# Patient Record
Sex: Female | Born: 1975 | Race: Black or African American | Hispanic: No | Smoking: Never smoker
Health system: Southern US, Community
[De-identification: ages and names within clinical notes are randomized; demographics above are authoritative.]

## PROBLEM LIST (undated history)

## (undated) DIAGNOSIS — T7840XA Allergy, unspecified, initial encounter: Secondary | ICD-10-CM

## (undated) HISTORY — PX: WISDOM TOOTH EXTRACTION: SHX21

## (undated) HISTORY — DX: Allergy, unspecified, initial encounter: T78.40XA

---

## 2008-11-06 ENCOUNTER — Emergency Department (HOSPITAL_COMMUNITY): Admission: EM | Admit: 2008-11-06 | Discharge: 2008-11-06 | Payer: Self-pay | Admitting: Emergency Medicine

## 2009-12-30 ENCOUNTER — Emergency Department (HOSPITAL_COMMUNITY): Admission: EM | Admit: 2009-12-30 | Discharge: 2009-12-30 | Payer: Self-pay | Admitting: Emergency Medicine

## 2009-12-30 DIAGNOSIS — M25579 Pain in unspecified ankle and joints of unspecified foot: Secondary | ICD-10-CM | POA: Insufficient documentation

## 2010-06-04 ENCOUNTER — Ambulatory Visit: Payer: Self-pay | Admitting: Vascular Surgery

## 2010-09-13 ENCOUNTER — Encounter: Payer: Self-pay | Admitting: Internal Medicine

## 2010-09-13 LAB — CONVERTED CEMR LAB

## 2011-01-12 ENCOUNTER — Ambulatory Visit: Admit: 2011-01-12 | Payer: Self-pay | Admitting: Internal Medicine

## 2011-01-21 ENCOUNTER — Other Ambulatory Visit: Payer: Self-pay | Admitting: Internal Medicine

## 2011-01-21 ENCOUNTER — Other Ambulatory Visit: Payer: Self-pay

## 2011-01-21 ENCOUNTER — Encounter: Payer: Self-pay | Admitting: Internal Medicine

## 2011-01-21 ENCOUNTER — Ambulatory Visit (INDEPENDENT_AMBULATORY_CARE_PROVIDER_SITE_OTHER): Payer: Medicaid Other | Admitting: Internal Medicine

## 2011-01-21 DIAGNOSIS — Z23 Encounter for immunization: Secondary | ICD-10-CM

## 2011-01-21 DIAGNOSIS — J45909 Unspecified asthma, uncomplicated: Secondary | ICD-10-CM | POA: Insufficient documentation

## 2011-01-21 DIAGNOSIS — Z0000 Encounter for general adult medical examination without abnormal findings: Secondary | ICD-10-CM

## 2011-01-21 DIAGNOSIS — J309 Allergic rhinitis, unspecified: Secondary | ICD-10-CM | POA: Insufficient documentation

## 2011-01-21 LAB — CBC WITH DIFFERENTIAL/PLATELET
Basophils Absolute: 0.1 10*3/uL (ref 0.0–0.1)
Basophils Relative: 0.8 % (ref 0.0–3.0)
Eosinophils Absolute: 0.3 10*3/uL (ref 0.0–0.7)
Eosinophils Relative: 2.8 % (ref 0.0–5.0)
HCT: 38.6 % (ref 36.0–46.0)
Hemoglobin: 13.3 g/dL (ref 12.0–15.0)
Lymphocytes Relative: 19.6 % (ref 12.0–46.0)
Lymphs Abs: 1.8 10*3/uL (ref 0.7–4.0)
MCHC: 34.5 g/dL (ref 30.0–36.0)
MCV: 88.9 fl (ref 78.0–100.0)
Monocytes Absolute: 0.7 10*3/uL (ref 0.1–1.0)
Monocytes Relative: 7.3 % (ref 3.0–12.0)
Neutro Abs: 6.2 10*3/uL (ref 1.4–7.7)
Neutrophils Relative %: 69.5 % (ref 43.0–77.0)
Platelets: 242 10*3/uL (ref 150.0–400.0)
RBC: 4.34 Mil/uL (ref 3.87–5.11)
RDW: 13.5 % (ref 11.5–14.6)
WBC: 9 10*3/uL (ref 4.5–10.5)

## 2011-01-21 LAB — LIPID PANEL
Cholesterol: 159 mg/dL (ref 0–200)
HDL: 74.1 mg/dL (ref >39.00)
LDL Cholesterol: 75 mg/dL (ref 0–99)
Total CHOL/HDL Ratio: 2
Triglycerides: 50 mg/dL (ref 0.0–149.0)
VLDL: 10 mg/dL (ref 0.0–40.0)

## 2011-01-21 LAB — HEPATIC FUNCTION PANEL
ALT: 15 U/L (ref 0–35)
AST: 19 U/L (ref 0–37)
Albumin: 4.1 g/dL (ref 3.5–5.2)
Alkaline Phosphatase: 65 U/L (ref 39–117)
Bilirubin, Direct: 0.1 mg/dL (ref 0.0–0.3)
Total Bilirubin: 0.4 mg/dL (ref 0.3–1.2)
Total Protein: 7.7 g/dL (ref 6.0–8.3)

## 2011-01-21 LAB — BASIC METABOLIC PANEL
BUN: 10 mg/dL (ref 6–23)
CO2: 28 mEq/L (ref 19–32)
Calcium: 9.3 mg/dL (ref 8.4–10.5)
Chloride: 105 mEq/L (ref 96–112)
Creatinine, Ser: 0.7 mg/dL (ref 0.4–1.2)
GFR: 133.29 mL/min (ref >60.00)
Glucose, Bld: 75 mg/dL (ref 70–99)
Potassium: 4.3 mEq/L (ref 3.5–5.1)
Sodium: 139 mEq/L (ref 135–145)

## 2011-01-21 LAB — TSH: TSH: 0.73 u[IU]/mL (ref 0.35–5.50)

## 2011-01-29 NOTE — Assessment & Plan Note (Signed)
Summary: Stephanie Moore,medicaid,#,lb   Vital Signs:  Patient profile:   35 year old female Height:      67 inches (170.18 cm) Weight:      150.0 pounds (68.18 kg) BMI:     23.58 O2 Sat:      98 % on Room air Temp:     99.2 degrees F (37.33 degrees C) oral Pulse rate:   70 / minute BP sitting:   92 / 62  (left arm) Cuff size:   regular  Vitals Entered By: Orlan Leavens RMA (January 21, 2011 10:17 AM)  O2 Flow:  Room air CC: Stephanie patient Is Patient Diabetic? No Pain Assessment Patient in pain? no        Primary Care Provider:  Newt Lukes MD  CC:  Stephanie patient.  History of Present Illness: Stephanie Moore to me and our prctice, here to est care  patient is here today for annual physical. Patient feels well and has no complaints.   reviewed chronic med issues asthma - flare every april-may - uses advair as needed and freq steroid shots during spring season  allg rhinitis - only seasonal - nop current symptoms - uses otc meds as needed   Preventive Screening-Counseling & Management  Alcohol-Tobacco     Alcohol drinks/day: <1     Alcohol Counseling: not indicated; use of alcohol is not excessive or problematic     Smoking Status: never     Tobacco Counseling: not indicated; no tobacco use  Caffeine-Diet-Exercise     Does Patient Exercise: yes     Exercise (avg: min/session): 30-60     Times/week: 4     Exercise Counseling: not indicated; exercise is adequate     Depression Counseling: not indicated; screening negative for depression  Safety-Violence-Falls     Seat Belt Counseling: to use seat belts when in vehicle     Helmet Counseling: not indicated; patient wears helmet when riding bicycle/motocycle     Firearm Counseling: not applicable     Violence Counseling: not applicable     Fall Risk Counseling: not indicated; no significant falls noted  Clinical Review Panels:  Prevention   Last Pap Smear:  Interpretation Result:Negative for intraepithelial Lesion or  Malignancy.    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 I have reviewed all other systems and they were negative.   Physical Exam  General:  alert, well-developed, well-nourished, and cooperative to examination.    Head:  Normocephalic and atraumatic without obvious abnormalities. No apparent alopecia or balding. Eyes:  vision grossly intact; pupils equal, round and reactive to light.  conjunctiva and lids normal.    Ears:  normal pinnae bilaterally, without erythema, swelling, or tenderness to palpation. TMs clear, without effusion, or cerumen impaction. Hearing grossly normal bilaterally  Mouth:  teeth  and gums in good repair; mucous membranes moist, without lesions or ulcers. oropharynx clear without exudate, no erythema.  Neck:  supple, full ROM, no masses, no thyromegaly; no thyroid nodules or tenderness. no JVD or carotid bruits.   Lungs:  normal respiratory effort, no intercostal retractions or use of accessory muscles; normal breath sounds bilaterally - no crackles and no wheezes.    Heart:  normal rate, regular rhythm, no murmur, and no rub. BLE without edema. normal DP pulses and normal cap refill in all 4 extremities    Abdomen:  soft, non-tender, normal bowel sounds, no distention; no masses and no appreciable  hepatomegaly or splenomegaly.   Genitalia:  defer gyn Msk:  No deformity or scoliosis noted of thoracic or lumbar spine.   Neurologic:  alert & oriented X3 and cranial nerves II-XII symetrically intact.  strength normal in all extremities, sensation intact to light touch, and gait normal. speech fluent without dysarthria or aphasia; follows commands with good comprehension.  Skin:  no rashes, vesicles, ulcers, or erythema. No nodules or irregularity to palpation.  Psych:  Oriented X3, memory intact for recent and remote, normally interactive, good eye contact, not anxious appearing, not depressed appearing, and not agitated.      Impression & Recommendations:  Problem # 1:  PREVENTIVE HEALTH CARE (ICD-V70.0) Patient has been counseled on age-appropriate routine health concerns for screening and prevention. These are reviewed and up-to-date. Immunizations are up-to-date or declined. Labsordered, will be reviewed.  Orders: TLB-Lipid Panel (80061-LIPID) TLB-BMP (Basic Metabolic Panel-BMET) (80048-METABOL) TLB-CBC Platelet - w/Differential (85025-CBCD) TLB-Hepatic/Liver Function Pnl (80076-HEPATIC) TLB-TSH (Thyroid Stimulating Hormone) (84443-TSH)  Complete Medication List: 1)  Advair Diskus 250-50 Mcg/dose Aepb (Fluticasone-salmeterol) .... Two times a day as needed  Other Orders: Tdap => 41yrs IM (16109) Admin 1st Vaccine (60454)  Patient Instructions: 1)  it was good to see you today. 2)  medications and histroy reviewed 3)  exam looks good today - Tdap shot today 4)  test(s) ordered today - your results will be posted on the phone tree for review in 48-72 hours from the time of test completion; call [phone] and enter your 9 digit MRN (listed above on this page, just below your name); if any changes need to be made or there are abnormal results, you will be contacted directly. 5)  Please schedule a follow-up appointment annually for medical physical and labs, call sooner if  problems.    Orders Added: 1)  TLB-Lipid Panel [80061-LIPID] 2)  TLB-BMP (Basic Metabolic Panel-BMET) [80048-METABOL] 3)  TLB-CBC Platelet - w/Differential [85025-CBCD] 4)  TLB-Hepatic/Liver Function Pnl [80076-HEPATIC] 5)  TLB-TSH (Thyroid Stimulating Hormone) [84443-TSH] 6)  Stephanie Patient 18-39 years [99385] 7)  Tdap => 70yrs IM [90715] 8)  Admin 1st Vaccine [90471]   Immunizations Administered:  Tetanus Vaccine:    Vaccine Type: Tdap    Site: left deltoid    Mfr: GlaxoSmithKline    Dose: 0.5 ml    Route: IM    Given by: Orlan Leavens RMA    Exp. Date: 10/02/2012    Lot #: [id number]    VIS given: 01/21/11   Immunizations Administered:  Tetanus Vaccine:    Vaccine Type: Tdap    Site: left deltoid    Mfr: GlaxoSmithKline    Dose: 0.5 ml    Route: IM    Given by: Orlan Leavens RMA    Exp. Date: 10/02/2012    Lot #: [id number]    VIS given: 01/21/11   Pap Smear  Procedure date:  09/13/2010  Findings:      Interpretation Result:Negative for intraepithelial Lesion or Malignancy.

## 2011-03-07 ENCOUNTER — Emergency Department (HOSPITAL_COMMUNITY)
Admission: EM | Admit: 2011-03-07 | Discharge: 2011-03-08 | Disposition: A | Discharge status: Home / Self Care | Payer: Medicaid Other | Attending: Emergency Medicine | Admitting: Emergency Medicine

## 2011-03-07 DIAGNOSIS — J45909 Unspecified asthma, uncomplicated: Secondary | ICD-10-CM | POA: Insufficient documentation

## 2011-03-14 ENCOUNTER — Encounter: Payer: Self-pay | Admitting: *Deleted

## 2011-03-19 ENCOUNTER — Encounter: Payer: Self-pay | Admitting: Internal Medicine

## 2011-03-19 ENCOUNTER — Ambulatory Visit (INDEPENDENT_AMBULATORY_CARE_PROVIDER_SITE_OTHER): Payer: Medicaid Other | Admitting: Internal Medicine

## 2011-03-19 DIAGNOSIS — J309 Allergic rhinitis, unspecified: Secondary | ICD-10-CM

## 2011-03-19 DIAGNOSIS — J45909 Unspecified asthma, uncomplicated: Secondary | ICD-10-CM

## 2011-03-19 MED ORDER — KETOTIFEN FUMARATE 0.025 % OP SOLN: 1.0000 [drp] | Freq: Two times a day (BID) | OPHTHALMIC | Status: AC

## 2011-03-19 MED ORDER — ALBUTEROL SULFATE HFA 108 (90 BASE) MCG/ACT IN AERS: 2.0000 | INHALATION_SPRAY | RESPIRATORY_TRACT | Status: DC | PRN

## 2011-03-19 MED ORDER — OMEPRAZOLE 20 MG PO CPDR: 20.0000 mg | DELAYED_RELEASE_CAPSULE | Freq: Every day | ORAL | Status: DC

## 2011-03-19 MED ORDER — FLUTICASONE PROPIONATE HFA 220 MCG/ACT IN AERO
2.0000 | INHALATION_SPRAY | Freq: Two times a day (BID) | RESPIRATORY_TRACT | Status: DC
Start: 2011-03-19 — End: 2011-03-26

## 2011-03-19 MED ORDER — LORATADINE 10 MG PO TABS: 10.0000 mg | ORAL_TABLET | Freq: Every day | ORAL | Status: DC

## 2011-03-19 MED ORDER — PREDNISONE (PAK) 10 MG PO TABS: 10.0000 mg | ORAL_TABLET | ORAL | Status: AC

## 2011-03-19 NOTE — Patient Instructions (Signed)
It was good to see you today. ER visit and medications reviewed today - use as explained - see below Your prescription(s) have been submitted to your pharmacy. Please take as directed and contact our office if you believe you are having problem(s) with the medication(s). Please schedule followup in 4-6 weeks to review allergy and asthma symptoms, call sooner if problems.

## 2011-03-19 NOTE — Assessment & Plan Note (Signed)
Acute exac with ER visit 3/24 explained importance of inhaled steroids in addition to rescue MDI Also allergy control and PPI x 30d Retreat 6 day pred pak for residual wheeze but no antibiotics indicated

## 2011-03-19 NOTE — Progress Notes (Signed)
  Subjective:    Patient ID: Stephanie Moore, female    DOB: [birth year], 35 y.o.   MRN: [id number]  HPI Here for ER followup Seen 3/24 at [facility] for asthma flare - tx pred pak, flovent and ventolin hfa Noncompliant with advair ("haven't had that medication in years") Improved but persisting DOE and wheeze symptoms worse with exertion or lying supine +cough, dry without sputum Chest tight but not painful +itch watery eye and allergy symptoms like sneezing and PND - not using any med for same +hx same each spring  Past Medical History  Diagnosis Date  . Allergy   . Asthma    Review of Systems  Constitutional: Negative for fever and fatigue.  HENT: Positive for congestion, sneezing and postnasal drip. Negative for ear pain.   Eyes: Positive for itching. Negative for pain.  Respiratory: Positive for cough, shortness of breath and wheezing. Negative for stridor.   Cardiovascular: Negative for chest pain and leg swelling.  Skin: Negative for rash.       Objective:   Physical Exam  Constitutional: She is oriented to person, place, and time. She appears well-developed and well-nourished. No distress.  HENT:  Head: Normocephalic and atraumatic.  Right Ear: External ear normal.  Nose: Nose normal.  Mouth/Throat: Oropharynx is clear and moist.       +PND  Neck: Normal range of motion. Neck supple.  Cardiovascular: Normal rate and regular rhythm.  Exam reveals friction rub.   No murmur heard. Pulmonary/Chest: She is in respiratory distress (mild increase WOB with conversation). She has wheezes. She has no rales.  Abdominal: Soft. Bowel sounds are normal.  Lymphadenopathy:    She has no cervical adenopathy.  Neurological: She is alert and oriented to person, place, and time.      Assessment & Plan:  See problem list. Medications and labs reviewed today. Time spent with the patient 30 minutes, greater than 50% time spent counseling patient on allergy, asthma and medication review. Also  review of ER records

## 2011-03-19 NOTE — Assessment & Plan Note (Signed)
Acute seasonal flare - medications reviewed Acute asthma exac and recent ER visit related to same -

## 2011-03-24 ENCOUNTER — Telehealth: Payer: Self-pay | Admitting: Internal Medicine

## 2011-03-24 NOTE — Telephone Encounter (Signed)
QVAR & Budesonide are the preferred drugs for Flovent w/ [facility] Medicaid Do you wish to proceed w/Flovent PA efforts? Please Advise.

## 2011-03-24 NOTE — Telephone Encounter (Signed)
PA requested for Flovent [facility] PA form requested from [facility] Medicaid  @[phone] 03/19/11 PA form received and forwarded to MD for completion 03/22/11 PA form returned from MD w/inquiry attached regarding preferred drugs for Flovent Called [place] [employer] to inquire on preferred drugs: QVAR & Budesonide Sent phone note to MD w/this information & inquiry if further PA efforts will be needed for Flovent.

## 2011-03-26 MED ORDER — BECLOMETHASONE DIPROPIONATE 80 MCG/ACT IN AERS: 2.0000 | INHALATION_SPRAY | Freq: Two times a day (BID) | RESPIRATORY_TRACT | Status: DC

## 2011-03-26 NOTE — Telephone Encounter (Signed)
No need for PA - flovent changed to qvar - new erx done - please let pt know same - thanks

## 2011-03-30 NOTE — Telephone Encounter (Signed)
New Rx information faxed to pharmacy.

## 2011-04-28 NOTE — Consult Note (Signed)
NEW PATIENT CONSULTATION   Stephanie Moore, Stephanie Moore  DOB:  09-18-1976                                       06/04/2010  ZOXWR#:[id number]   The patient is in today for evaluation of pain in her left posterior  popliteal fossa.  She is an otherwise healthy, active black female with  no major medical difficulties.  She reports that she is quite active in  an exercise program and does have discomfort in the area of her  popliteal fossa extending just above the popliteal crease to the  proximal calf.  This is worse with prolonged standing.  She has worn  support hose and this has not given her any improvement and actually has  made this worse.  She has no history of DVT or bleeding.   PAST MEDICAL HISTORY:  Is negative for diabetes, hypertension, elevated  cholesterol or cardiac disease.   FAMILY HISTORY:  Is negative for premature atherosclerotic disease.   SOCIAL HISTORY:  She is single.  She has three children.  She is not  retired.  She cleans houses.  She does not smoke and has occasional  social alcohol consumption.   REVIEW OF SYSTEMS:  No weight loss or weight gain.  Her weight is  reported 145 pounds.  She is 5 feet 6 inches tall.  CARDIAC:  Is negative.  PULMONARY:  Positive for asthma.  GI/GU:  Is negative.  VASCULAR:  Positive only for discomfort in her posterior fossa.  NEUROLOGIC:  Negative.  MUSCULOSKELETAL:  Does have some knee pain in the joint itself on the  left.  PSYCHIATRIC, ENT, HEMATOLOGIC, SKIN:  Are all negative.   PHYSICAL EXAM:  Well-developed, well-nourished black female appearing  stated age of 82 in no acute distress.  Blood pressure 90/60, pulse 68,  respirations 18.  HEENT is normal.  She has 2+ radial, 2+ dorsalis pedis  pulses bilaterally.  Musculoskeletal:  Shows no major deformities or  cyanosis.  Neurologic:  No focal weakness or paresthesias.  Skin:  Without ulcers or rashes.  She does have no evidence of telangiectasia  on her  right leg, on her left leg she has prominent telangiectasia that  are above the skin in her popliteal fossa and extending just above and  just below this level.   She underwent noninvasive vascular laboratory studies in our office  which I have ordered and interpreted and reviewed with the patient.  This shows no evidence of deep or superficial reflux with normal  vascular exam.  I discussed this with the patient.  Since her discomfort  is related to this specific area, I feel that in all likelihood it is  related to these prominent telangiectasia.  I explained the treatment  for this would be sclerotherapy of this area.  I explained the out-of-  pocket expense related to this since it would not be covered.  She  understands and wishes to proceed when she can fit this in her schedule.  She understands that hopefully this will resolve the pain but that there  is a chance that it is unrelated, although it is in the same region and  that she may benefit from a cosmetic standpoint but not necessarily from  a discomfort standpoint.  She understands this and wishes to proceed.     Larina Earthly, M.D.  Electronically Signed  TFE/MEDQ  D:  06/04/2010  T:  06/05/2010  Job:  [id number]

## 2011-04-28 NOTE — Procedures (Signed)
LOWER EXTREMITY VENOUS REFLUX EXAM   INDICATION:  Followup painful varicose veins.   EXAM:  Using color-flow imaging and pulse Doppler spectral analysis, the  bilateral common femoral, superficial femoral, popliteal, posterior  tibial, greater and lesser saphenous veins were evaluated.  There is no  evidence suggesting deep venous insufficiency in the bilateral lower  extremity.   The bilateral saphenofemoral junction is competent.  The bilateral GSV  is competent.   The bilateral proximal short saphenous vein demonstrates competency.   GSV Diameter (used if found to be incompetent only)                                            Right    Left  Proximal Greater Saphenous Vein           cm       cm  Proximal-to-mid-thigh                     cm       cm  Mid thigh                                 cm       cm  Mid-distal thigh                          cm       cm  Distal thigh                              cm       cm  Knee                                      cm       cm   IMPRESSION:  1. There is no evidence of reflux in the bilateral greater saphenous      vein.  2. The bilateral greater saphenous vein is not aneurysmal.  3. The bilateral greater saphenous vein is not tortuous.  4. The deep venous system is competent.  5. The bilateral lesser saphenous vein is competent.   ___________________________________________  Larina Earthly, M.D.   CB/MEDQ  D:  06/05/2010  T:  06/05/2010  Job:  [id number]

## 2011-12-21 ENCOUNTER — Emergency Department (HOSPITAL_COMMUNITY): Payer: Self-pay

## 2011-12-21 ENCOUNTER — Emergency Department (HOSPITAL_COMMUNITY)
Admission: EM | Admit: 2011-12-21 | Discharge: 2011-12-21 | Disposition: A | Discharge status: Home / Self Care | Payer: Self-pay | Attending: Emergency Medicine | Admitting: Emergency Medicine

## 2011-12-21 ENCOUNTER — Encounter (HOSPITAL_COMMUNITY): Payer: Self-pay | Admitting: Emergency Medicine

## 2011-12-21 DIAGNOSIS — B349 Viral infection, unspecified: Secondary | ICD-10-CM

## 2011-12-21 DIAGNOSIS — E860 Dehydration: Secondary | ICD-10-CM | POA: Insufficient documentation

## 2011-12-21 DIAGNOSIS — N9489 Other specified conditions associated with female genital organs and menstrual cycle: Secondary | ICD-10-CM | POA: Insufficient documentation

## 2011-12-21 DIAGNOSIS — R10819 Abdominal tenderness, unspecified site: Secondary | ICD-10-CM | POA: Insufficient documentation

## 2011-12-21 DIAGNOSIS — R1110 Vomiting, unspecified: Secondary | ICD-10-CM | POA: Insufficient documentation

## 2011-12-21 DIAGNOSIS — R109 Unspecified abdominal pain: Secondary | ICD-10-CM | POA: Insufficient documentation

## 2011-12-21 DIAGNOSIS — B9789 Other viral agents as the cause of diseases classified elsewhere: Secondary | ICD-10-CM | POA: Insufficient documentation

## 2011-12-21 LAB — HEPATIC FUNCTION PANEL
ALT: 13 U/L (ref 0–35)
AST: 18 U/L (ref 0–37)
Albumin: 3.6 g/dL (ref 3.5–5.2)
Alkaline Phosphatase: 48 U/L (ref 39–117)
Bilirubin, Direct: 0.1 mg/dL (ref 0.0–0.3)
Indirect Bilirubin: 0.2 mg/dL — ABNORMAL LOW (ref 0.3–0.9)
Total Bilirubin: 0.3 mg/dL (ref 0.3–1.2)
Total Protein: 7.5 g/dL (ref 6.0–8.3)

## 2011-12-21 LAB — WET PREP, GENITAL
Trich, Wet Prep: NONE SEEN
Yeast Wet Prep HPF POC: NONE SEEN

## 2011-12-21 LAB — URINALYSIS, ROUTINE W REFLEX MICROSCOPIC
Glucose, UA: NEGATIVE mg/dL
Hgb urine dipstick: NEGATIVE
Leukocytes, UA: NEGATIVE
Nitrite: NEGATIVE
Protein, ur: 30 mg/dL — AB
Specific Gravity, Urine: 1.038 — ABNORMAL HIGH (ref 1.005–1.030)
Urobilinogen, UA: 2 mg/dL — ABNORMAL HIGH (ref 0.0–1.0)
pH: 6.5 (ref 5.0–8.0)

## 2011-12-21 LAB — CBC
HCT: 34.6 % — ABNORMAL LOW (ref 36.0–46.0)
Hemoglobin: 12.5 g/dL (ref 12.0–15.0)
MCH: 29.2 pg (ref 26.0–34.0)
MCHC: 36.1 g/dL — ABNORMAL HIGH (ref 30.0–36.0)
MCV: 80.8 fL (ref 78.0–100.0)
Platelets: 164 10*3/uL (ref 150–400)
RBC: 4.28 MIL/uL (ref 3.87–5.11)
RDW: 13.2 % (ref 11.5–15.5)
WBC: 4.3 10*3/uL (ref 4.0–10.5)

## 2011-12-21 LAB — BASIC METABOLIC PANEL
BUN: 8 mg/dL (ref 6–23)
CO2: 27 mEq/L (ref 19–32)
Calcium: 8.6 mg/dL (ref 8.4–10.5)
Chloride: 104 mEq/L (ref 96–112)
Creatinine, Ser: 0.68 mg/dL (ref 0.50–1.10)
GFR calc Af Amer: >90 mL/min (ref >90)
GFR calc non Af Amer: >90 mL/min (ref >90)
Glucose, Bld: 90 mg/dL (ref 70–99)
Potassium: 3.3 mEq/L — ABNORMAL LOW (ref 3.5–5.1)
Sodium: 140 mEq/L (ref 135–145)

## 2011-12-21 LAB — URINE MICROSCOPIC-ADD ON

## 2011-12-21 LAB — DIFFERENTIAL
Basophils Absolute: 0.1 10*3/uL (ref 0.0–0.1)
Basophils Relative: 2 % — ABNORMAL HIGH (ref 0–1)
Eosinophils Absolute: 0.1 10*3/uL (ref 0.0–0.7)
Eosinophils Relative: 3 % (ref 0–5)
Lymphocytes Relative: 52 % — ABNORMAL HIGH (ref 12–46)
Lymphs Abs: 2.3 10*3/uL (ref 0.7–4.0)
Monocytes Absolute: 0.8 10*3/uL (ref 0.1–1.0)
Monocytes Relative: 17 % — ABNORMAL HIGH (ref 3–12)
Neutro Abs: 1.1 10*3/uL — ABNORMAL LOW (ref 1.7–7.7)
Neutrophils Relative %: 26 % — ABNORMAL LOW (ref 43–77)

## 2011-12-21 LAB — PREGNANCY, URINE: Preg Test, Ur: NEGATIVE

## 2011-12-21 LAB — LIPASE, BLOOD: Lipase: 21 U/L (ref 11–59)

## 2011-12-21 MED ORDER — MORPHINE SULFATE 4 MG/ML IJ SOLN
4.0000 mg | Freq: Once | INTRAMUSCULAR | Status: AC
  Administered 2011-12-21: 4 mg via INTRAVENOUS
  Filled 2011-12-21: qty 1

## 2011-12-21 MED ORDER — IOHEXOL 300 MG/ML  SOLN
100.0000 mL | Freq: Once | INTRAMUSCULAR | Status: AC | PRN
  Administered 2011-12-21: 100 mL via INTRAVENOUS

## 2011-12-21 MED ORDER — SODIUM CHLORIDE 0.9 % IV BOLUS (SEPSIS)
1000.0000 mL | Freq: Once | INTRAVENOUS | Status: AC
  Administered 2011-12-21: 1000 mL via INTRAVENOUS

## 2011-12-21 MED ORDER — ONDANSETRON HCL 4 MG/2ML IJ SOLN
4.0000 mg | Freq: Once | INTRAMUSCULAR | Status: AC
  Administered 2011-12-21: 4 mg via INTRAVENOUS
  Filled 2011-12-21: qty 2
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
## 2011-12-21 NOTE — ED Notes (Signed)
Patient transported to Ultrasound 

## 2011-12-21 NOTE — ED Notes (Signed)
Returned from U/S

## 2011-12-22 LAB — GC/CHLAMYDIA PROBE AMP, GENITAL
Chlamydia, DNA Probe: NEGATIVE
GC Probe Amp, Genital: NEGATIVE

## 2012-01-18 ENCOUNTER — Emergency Department (HOSPITAL_COMMUNITY): Payer: Medicaid Other

## 2012-01-18 ENCOUNTER — Emergency Department (HOSPITAL_COMMUNITY)
Admission: EM | Admit: 2012-01-18 | Discharge: 2012-01-18 | Disposition: A | Discharge status: Home / Self Care | Payer: Medicaid Other | Attending: Emergency Medicine | Admitting: Emergency Medicine

## 2012-01-18 ENCOUNTER — Encounter (HOSPITAL_COMMUNITY): Payer: Self-pay | Admitting: Emergency Medicine

## 2012-01-18 DIAGNOSIS — S161XXA Strain of muscle, fascia and tendon at neck level, initial encounter: Secondary | ICD-10-CM

## 2012-01-18 DIAGNOSIS — R404 Transient alteration of awareness: Secondary | ICD-10-CM | POA: Insufficient documentation

## 2012-01-18 DIAGNOSIS — M542 Cervicalgia: Secondary | ICD-10-CM | POA: Insufficient documentation

## 2012-01-18 DIAGNOSIS — R42 Dizziness and giddiness: Secondary | ICD-10-CM | POA: Insufficient documentation

## 2012-01-18 DIAGNOSIS — S060X9A Concussion with loss of consciousness of unspecified duration, initial encounter: Secondary | ICD-10-CM | POA: Insufficient documentation

## 2012-01-18 DIAGNOSIS — S139XXA Sprain of joints and ligaments of unspecified parts of neck, initial encounter: Secondary | ICD-10-CM | POA: Insufficient documentation

## 2012-01-18 DIAGNOSIS — S060XAA Concussion with loss of consciousness status unknown, initial encounter: Secondary | ICD-10-CM | POA: Insufficient documentation

## 2012-01-18 DIAGNOSIS — R51 Headache: Secondary | ICD-10-CM | POA: Insufficient documentation

## 2012-01-18 DIAGNOSIS — S0101XA Laceration without foreign body of scalp, initial encounter: Secondary | ICD-10-CM

## 2012-01-18 DIAGNOSIS — S0100XA Unspecified open wound of scalp, initial encounter: Secondary | ICD-10-CM | POA: Insufficient documentation

## 2012-01-18 DIAGNOSIS — J45909 Unspecified asthma, uncomplicated: Secondary | ICD-10-CM | POA: Insufficient documentation

## 2012-01-18 MED ORDER — HYDROCODONE-ACETAMINOPHEN 5-500 MG PO TABS: 1.0000 | ORAL_TABLET | Freq: Four times a day (QID) | ORAL | Status: DC | PRN
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
## 2012-01-18 NOTE — ED Notes (Signed)
Pt stated she no longer wants to be a confidential pt and is requesting registration to remove her name as XXX.  I explained to her that anyone can call and ask if she is a pt and that information will be provided to them.  Pt agreed that this is what she wants.
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
## 2012-01-18 NOTE — ED Notes (Signed)
Patient undressed and in a gown. Blood pressure and pulse ox on.

## 2012-01-20 ENCOUNTER — Emergency Department (HOSPITAL_COMMUNITY)
Admission: EM | Admit: 2012-01-20 | Discharge: 2012-01-20 | Disposition: A | Discharge status: Home / Self Care | Payer: Medicaid Other | Attending: Emergency Medicine | Admitting: Emergency Medicine

## 2012-01-20 ENCOUNTER — Encounter (HOSPITAL_COMMUNITY): Payer: Self-pay | Admitting: *Deleted

## 2012-01-20 ENCOUNTER — Emergency Department (HOSPITAL_COMMUNITY): Payer: Medicaid Other

## 2012-01-20 DIAGNOSIS — R51 Headache: Secondary | ICD-10-CM | POA: Insufficient documentation

## 2012-01-20 DIAGNOSIS — R209 Unspecified disturbances of skin sensation: Secondary | ICD-10-CM | POA: Insufficient documentation

## 2012-01-20 DIAGNOSIS — R519 Headache, unspecified: Secondary | ICD-10-CM

## 2012-01-20 DIAGNOSIS — J45909 Unspecified asthma, uncomplicated: Secondary | ICD-10-CM | POA: Insufficient documentation

## 2012-01-20 MED ORDER — ACETAMINOPHEN 325 MG PO TABS
ORAL_TABLET | ORAL | Status: AC
  Administered 2012-01-20: 975 mg via ORAL
  Filled 2012-01-20: qty 3

## 2012-01-20 MED ORDER — ACETAMINOPHEN 500 MG PO TABS
1000.0000 mg | ORAL_TABLET | ORAL | Status: AC
  Administered 2012-01-20: 975 mg via ORAL
  Filled 2012-01-20: qty 2

## 2012-01-20 NOTE — ED Notes (Signed)
Patient transported to CT 

## 2012-01-20 NOTE — ED Notes (Signed)
PT has small swollen area around suture line in right side of head; reports very tender; throbbing; and tingling in face; can feel RN touch but reports it feels like when she has been numbed at dentist. Radiates to upper cheeks and side of jaw on right side.

## 2012-01-20 NOTE — ED Notes (Signed)
Pt ambulated with a steady gait; VSS; A&Ox3; respirations even and unlabored; reports feeling better; skin warm and dry; no questions at this time.

## 2012-01-20 NOTE — ED Notes (Signed)
Pt returned from CT °
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
## 2015-03-06 ENCOUNTER — Other Ambulatory Visit: Payer: Self-pay | Admitting: *Deleted

## 2015-03-06 DIAGNOSIS — R309 Painful micturition, unspecified: Secondary | ICD-10-CM

## 2015-03-06 MED ORDER — PHENAZOPYRIDINE HCL 200 MG PO TABS: 200.0000 mg | ORAL_TABLET | Freq: Three times a day (TID) | ORAL | Status: DC | PRN

## 2015-03-07 ENCOUNTER — Ambulatory Visit (INDEPENDENT_AMBULATORY_CARE_PROVIDER_SITE_OTHER): Payer: Medicaid Other | Admitting: Certified Nurse Midwife

## 2015-03-07 ENCOUNTER — Encounter: Payer: Self-pay | Admitting: Certified Nurse Midwife

## 2015-03-07 VITALS — BP 105/78 | HR 68 | Temp 98.8°F | Ht 66.5 in | Wt 156.0 lb

## 2015-03-07 DIAGNOSIS — R399 Unspecified symptoms and signs involving the genitourinary system: Secondary | ICD-10-CM | POA: Diagnosis not present

## 2015-03-07 MED ORDER — NITROFURANTOIN MONOHYD MACRO 100 MG PO CAPS: 100.0000 mg | ORAL_CAPSULE | Freq: Two times a day (BID) | ORAL | Status: AC
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
## 2015-03-08 LAB — URINE CULTURE
Colony Count: NO GROWTH
Organism ID, Bacteria: NO GROWTH

## 2015-03-19 ENCOUNTER — Encounter (HOSPITAL_COMMUNITY): Payer: Self-pay | Admitting: Emergency Medicine

## 2015-03-19 ENCOUNTER — Emergency Department (HOSPITAL_COMMUNITY)
Admission: EM | Admit: 2015-03-19 | Discharge: 2015-03-19 | Disposition: A | Discharge status: Home / Self Care | Payer: Medicaid Other | Source: Home / Self Care | Attending: Family Medicine | Admitting: Family Medicine

## 2015-03-19 DIAGNOSIS — J9801 Acute bronchospasm: Secondary | ICD-10-CM

## 2015-03-19 DIAGNOSIS — J302 Other seasonal allergic rhinitis: Secondary | ICD-10-CM

## 2015-03-19 MED ORDER — ALBUTEROL SULFATE HFA 108 (90 BASE) MCG/ACT IN AERS: 1.0000 | INHALATION_SPRAY | Freq: Four times a day (QID) | RESPIRATORY_TRACT | Status: DC | PRN

## 2015-03-19 MED ORDER — PREDNISONE 20 MG PO TABS
ORAL_TABLET | ORAL | Status: AC
  Filled 2015-03-19: qty 3

## 2015-03-19 MED ORDER — ALBUTEROL SULFATE (2.5 MG/3ML) 0.083% IN NEBU
5.0000 mg | INHALATION_SOLUTION | Freq: Once | RESPIRATORY_TRACT | Status: AC
  Administered 2015-03-19: 5 mg via RESPIRATORY_TRACT

## 2015-03-19 MED ORDER — ALBUTEROL SULFATE (2.5 MG/3ML) 0.083% IN NEBU
INHALATION_SOLUTION | RESPIRATORY_TRACT | Status: AC
  Filled 2015-03-19: qty 3

## 2015-03-19 MED ORDER — PREDNISONE 10 MG PO TABS: ORAL_TABLET | ORAL | Status: DC

## 2015-03-19 MED ORDER — IPRATROPIUM-ALBUTEROL 0.5-2.5 (3) MG/3ML IN SOLN
RESPIRATORY_TRACT | Status: AC
  Filled 2015-03-19: qty 3

## 2015-03-19 MED ORDER — IPRATROPIUM BROMIDE 0.02 % IN SOLN
0.5000 mg | Freq: Once | RESPIRATORY_TRACT | Status: AC
  Administered 2015-03-19: 0.5 mg via RESPIRATORY_TRACT

## 2015-03-19 MED ORDER — LORATADINE 10 MG PO TABS: 10.0000 mg | ORAL_TABLET | Freq: Every day | ORAL | Status: DC

## 2015-03-19 MED ORDER — FLUTICASONE PROPIONATE 50 MCG/ACT NA SUSP: 2.0000 | Freq: Every day | NASAL | Status: DC

## 2015-03-19 MED ORDER — PREDNISONE 20 MG PO TABS
60.0000 mg | ORAL_TABLET | Freq: Once | ORAL | Status: AC
Start: 2015-03-19 — End: 2015-03-19
  Administered 2015-03-19: 60 mg via ORAL

## 2015-03-19 NOTE — ED Notes (Signed)
Patient reviewing instructions, discovered answer to her question.  Declined to wait for lee pa any longer

## 2015-03-19 NOTE — Discharge Instructions (Signed)
Allergic Rhinitis Allergic rhinitis is when the mucous membranes in the nose respond to allergens. Allergens are particles in the air that cause your body to have an allergic reaction. This causes you to release allergic antibodies. Through a chain of events, these eventually cause you to release histamine into the blood stream. Although meant to protect the body, it is this release of histamine that causes your discomfort, such as frequent sneezing, congestion, and an itchy, runny nose.  CAUSES  Seasonal allergic rhinitis (hay fever) is caused by pollen allergens that may come from grasses, trees, and weeds. Year-round allergic rhinitis (perennial allergic rhinitis) is caused by allergens such as house dust mites, pet dander, and mold spores.  SYMPTOMS   Nasal stuffiness (congestion).  Itchy, runny nose with sneezing and tearing of the eyes. DIAGNOSIS  Your health care provider can help you determine the allergen or allergens that trigger your symptoms. If you and your health care provider are unable to determine the allergen, skin or blood testing may be used. TREATMENT  Allergic rhinitis does not have a cure, but it can be controlled by:  Medicines and allergy shots (immunotherapy).  Avoiding the allergen. Hay fever may often be treated with antihistamines in pill or nasal spray forms. Antihistamines block the effects of histamine. There are over-the-counter medicines that may help with nasal congestion and swelling around the eyes. Check with your health care provider before taking or giving this medicine.  If avoiding the allergen or the medicine prescribed do not work, there are many new medicines your health care provider can prescribe. Stronger medicine may be used if initial measures are ineffective. Desensitizing injections can be used if medicine and avoidance does not work. Desensitization is when a patient is given ongoing shots until the body becomes less sensitive to the allergen.  Make sure you follow up with your health care provider if problems continue. HOME CARE INSTRUCTIONS It is not possible to completely avoid allergens, but you can reduce your symptoms by taking steps to limit your exposure to them. It helps to know exactly what you are allergic to so that you can avoid your specific triggers. SEEK MEDICAL CARE IF:   You have a fever.  You develop a cough that does not stop easily (persistent).  You have shortness of breath.  You start wheezing.  Symptoms interfere with normal daily activities. Document Released: 08/25/2001 Document Revised: 12/05/2013 Document Reviewed: 08/07/2013 [facility] Patient Information 2015 [place], [place]. This information is not intended to replace advice given to you by your health care provider. Make sure you discuss any questions you have with your health care provider.  Asthma, Acute Bronchospasm Acute bronchospasm caused by asthma is also referred to as an asthma attack. Bronchospasm means your air passages become narrowed. The narrowing is caused by inflammation and tightening of the muscles in the air tubes (bronchi) in your lungs. This can make it hard to breathe or cause you to wheeze and cough. CAUSES Possible triggers are:  Animal dander from the skin, hair, or feathers of animals.  Dust mites contained in house dust.  Cockroaches.  Pollen from trees or grass.  Mold.  Cigarette or tobacco smoke.  Air pollutants such as dust, household cleaners, hair sprays, aerosol sprays, paint fumes, strong chemicals, or strong odors.  Cold air or weather changes. Cold air may trigger inflammation. Winds increase molds and pollens in the air.  Strong emotions such as crying or laughing hard.  Stress.  Certain medicines such as aspirin  or beta-blockers.  Sulfites in foods and drinks, such as dried fruits and wine.  Infections or inflammatory conditions, such as a flu, cold, or inflammation of the nasal membranes  (rhinitis).  Gastroesophageal reflux disease (GERD). GERD is a condition where stomach acid backs up into your esophagus.  Exercise or strenuous activity. SIGNS AND SYMPTOMS   Wheezing.  Excessive coughing, particularly at night.  Chest tightness.  Shortness of breath. DIAGNOSIS  Your health care provider will ask you about your medical history and perform a physical exam. A chest X-ray or blood testing may be performed to look for other causes of your symptoms or other conditions that may have triggered your asthma attack. TREATMENT  Treatment is aimed at reducing inflammation and opening up the airways in your lungs. Most asthma attacks are treated with inhaled medicines. These include quick relief or rescue medicines (such as bronchodilators) and controller medicines (such as inhaled corticosteroids). These medicines are sometimes given through an inhaler or a nebulizer. Systemic steroid medicine taken by mouth or given through an IV tube also can be used to reduce the inflammation when an attack is moderate or severe. Antibiotic medicines are only used if a bacterial infection is present.  HOME CARE INSTRUCTIONS   Rest.  Drink plenty of liquids. This helps the mucus to remain thin and be easily coughed up. Only use caffeine in moderation and do not use alcohol until you have recovered from your illness.  Do not smoke. Avoid being exposed to secondhand smoke.  You play a critical role in keeping yourself in good health. Avoid exposure to things that cause you to wheeze or to have breathing problems.  Keep your medicines up-to-date and available. Carefully follow your health care provider's treatment plan.  Take your medicine exactly as prescribed.  When pollen or pollution is bad, keep windows closed and use an air conditioner or go to places with air conditioning.  Asthma requires careful medical care. See your health care provider for a follow-up as advised. If you are more  than [redacted] weeks pregnant and you were prescribed any new medicines, let your obstetrician know about the visit and how you are doing. Follow up with your health care provider as directed.  After you have recovered from your asthma attack, make an appointment with your outpatient doctor to talk about ways to reduce the likelihood of future attacks. If you do not have a doctor who manages your asthma, make an appointment with a primary care doctor to discuss your asthma. SEEK IMMEDIATE MEDICAL CARE IF:   You are getting worse.  You have trouble breathing. If severe, call your local emergency services (911 in the [place]  You develop chest pain or discomfort.  You are vomiting.  You are not able to keep fluids down.  You are coughing up yellow, green, brown, or bloody sputum.  You have a fever and your symptoms suddenly get worse.  You have trouble swallowing. MAKE SURE YOU:   Understand these instructions.  Will watch your condition.  Will get help right away if you are not doing well or get worse. Document Released: 03/17/2007 Document Revised: 12/05/2013 Document Reviewed: 06/07/2013 [facility] Patient Information 2015 [place], [place]. This information is not intended to replace advice given to you by your health care provider. Make sure you discuss any questions you have with your health care provider.  Asthma Attack Prevention Although there is no way to prevent asthma from starting, you can take steps to control the disease  and reduce its symptoms. Learn about your asthma and how to control it. Take an active role to control your asthma by working with your health care provider to create and follow an asthma action plan. An asthma action plan guides you in:  Taking your medicines properly.  Avoiding things that set off your asthma or make your asthma worse (asthma triggers).  Tracking your level of asthma control.  Responding to worsening asthma.  Seeking emergency care when  needed. To track your asthma, keep records of your symptoms, check your peak flow number using a handheld device that shows how well air moves out of your lungs (peak flow meter), and get regular asthma checkups.  WHAT ARE SOME WAYS TO PREVENT AN ASTHMA ATTACK?  Take medicines as directed by your health care provider.  Keep track of your asthma symptoms and level of control.  With your health care provider, write a detailed plan for taking medicines and managing an asthma attack. Then be sure to follow your action plan. Asthma is an ongoing condition that needs regular monitoring and treatment.  Identify and avoid asthma triggers. Many outdoor allergens and irritants (such as pollen, mold, cold air, and air pollution) can trigger asthma attacks. Find out what your asthma triggers are and take steps to avoid them.  Monitor your breathing. Learn to recognize warning signs of an attack, such as coughing, wheezing, or shortness of breath. Your lung function may decrease before you notice any signs or symptoms, so regularly measure and record your peak airflow with a home peak flow meter.  Identify and treat attacks early. If you act quickly, you are less likely to have a severe attack. You will also need less medicine to control your symptoms. When your peak flow measurements decrease and alert you to an upcoming attack, take your medicine as instructed and immediately stop any activity that may have triggered the attack. If your symptoms do not improve, get medical help.  Pay attention to increasing quick-relief inhaler use. If you find yourself relying on your quick-relief inhaler, your asthma is not under control. See your health care provider about adjusting your treatment. WHAT CAN MAKE MY SYMPTOMS WORSE? A number of common things can set off or make your asthma symptoms worse and cause temporary increased inflammation of your airways. Keep track of your asthma symptoms for several weeks,  detailing all the environmental and emotional factors that are linked with your asthma. When you have an asthma attack, go back to your asthma diary to see which factor, or combination of factors, might have contributed to it. Once you know what these factors are, you can take steps to control many of them. If you have allergies and asthma, it is important to take asthma prevention steps at home. Minimizing contact with the substance to which you are allergic will help prevent an asthma attack. Some triggers and ways to avoid these triggers are: Animal Dander:  Some people are allergic to the flakes of skin or dried saliva from animals with fur or feathers.   There is no such thing as a hypoallergenic dog or cat breed. All dogs or cats can cause allergies, even if they don't shed.  Keep these pets out of your home.  If you are not able to keep a pet outdoors, keep the pet out of your bedroom and other sleeping areas at all times, and keep the door closed.  Remove carpets and furniture covered with cloth from your home. If that is  not possible, keep the pet away from fabric-covered furniture and carpets. Dust Mites: Many people with asthma are allergic to dust mites. Dust mites are tiny bugs that are found in every home in mattresses, pillows, carpets, fabric-covered furniture, bedcovers, clothes, stuffed toys, and other fabric-covered items.   Cover your mattress in a special dust-proof cover.  Cover your pillow in a special dust-proof cover, or wash the pillow each week in hot water. Water must be hotter than 130 F (54.4 C) to kill dust mites. Cold or warm water used with detergent and bleach can also be effective.  Wash the sheets and blankets on your bed each week in hot water.  Try not to sleep or lie on cloth-covered cushions.  Call ahead when traveling and ask for a smoke-free hotel room. Bring your own bedding and pillows in case the hotel only supplies feather pillows and down  comforters, which may contain dust mites and cause asthma symptoms.  Remove carpets from your bedroom and those laid on concrete, if you can.  Keep stuffed toys out of the bed, or wash the toys weekly in hot water or cooler water with detergent and bleach. Cockroaches: Many people with asthma are allergic to the droppings and remains of cockroaches.   Keep food and garbage in closed containers. Never leave food out.  Use poison baits, traps, powders, gels, or paste (for example, boric acid).  If a spray is used to kill cockroaches, stay out of the room until the odor goes away. Indoor Mold:  Fix leaky faucets, pipes, or other sources of water that have mold around them.  Clean floors and moldy surfaces with a fungicide or diluted bleach.  Avoid using humidifiers, vaporizers, or swamp coolers. These can spread molds through the air. Pollen and Outdoor Mold:  When pollen or mold spore counts are high, try to keep your windows closed.  Stay indoors with windows closed from late morning to afternoon. Pollen and some mold spore counts are highest at that time.  Ask your health care provider whether you need to take anti-inflammatory medicine or increase your dose of the medicine before your allergy season starts. Other Irritants to Avoid:  Tobacco smoke is an irritant. If you smoke, ask your health care provider how you can quit. Ask family members to quit smoking, too. Do not allow smoking in your home or car.  If possible, do not use a wood-burning stove, kerosene heater, or fireplace. Minimize exposure to all sources of smoke, including incense, candles, fires, and fireworks.  Try to stay away from strong odors and sprays, such as perfume, talcum powder, hair spray, and paints.  Decrease humidity in your home and use an indoor air cleaning device. Reduce indoor humidity to below 60%. Dehumidifiers or central air conditioners can do this.  Decrease house dust exposure by changing  furnace and air cooler filters frequently.  Try to have someone else vacuum for you once or twice a week. Stay out of rooms while they are being vacuumed and for a short while afterward.  If you vacuum, use a dust mask from a hardware store, a double-layered or microfilter vacuum cleaner bag, or a vacuum cleaner with a HEPA filter.  Sulfites in foods and beverages can be irritants. Do not drink beer or wine or eat dried fruit, processed potatoes, or shrimp if they cause asthma symptoms.  Cold air can trigger an asthma attack. Cover your nose and mouth with a scarf on cold or windy days.  Several health conditions can make asthma more difficult to manage, including a runny nose, sinus infections, reflux disease, psychological stress, and sleep apnea. Work with your health care provider to manage these conditions.  Avoid close contact with people who have a respiratory infection such as a cold or the flu, since your asthma symptoms may get worse if you catch the infection. Wash your hands thoroughly after touching items that may have been handled by people with a respiratory infection.  Get a flu shot every year to protect against the flu virus, which often makes asthma worse for days or weeks. Also get a pneumonia shot if you have not previously had one. Unlike the flu shot, the pneumonia shot does not need to be given yearly. Medicines:  Talk to your health care provider about whether it is safe for you to take aspirin or non-steroidal anti-inflammatory medicines (NSAIDs). In a small number of people with asthma, aspirin and NSAIDs can cause asthma attacks. These medicines must be avoided by people who have known aspirin-sensitive asthma. It is important that people with aspirin-sensitive asthma read labels of all over-the-counter medicines used to treat pain, colds, coughs, and fever.  Beta-blockers and ACE inhibitors are other medicines you should discuss with your health care provider. HOW CAN  I FIND OUT WHAT I AM ALLERGIC TO? Ask your asthma health care provider about allergy skin testing or blood testing (the RAST test) to identify the allergens to which you are sensitive. If you are found to have allergies, the most important thing to do is to try to avoid exposure to any allergens that you are sensitive to as much as possible. Other treatments for allergies, such as medicines and allergy shots (immunotherapy) are available.  CAN I EXERCISE? Follow your health care provider's advice regarding asthma treatment before exercising. It is important to maintain a regular exercise program, but vigorous exercise or exercise in cold, humid, or dry environments can cause asthma attacks, especially for those people who have exercise-induced asthma. Document Released: 11/18/2009 Document Revised: 12/05/2013 Document Reviewed: 06/07/2013 [facility] Patient Information 2015 [place]. This information is not intended to replace advice given to you by your health care provider. Make sure you discuss any questions you have with your health care provider.  Hay Fever Hay fever is an allergic reaction to particles in the air. It cannot be passed from person to person. It cannot be cured, but it can be controlled. CAUSES  Hay fever is caused by something that triggers an allergic reaction (allergens). The following are examples of allergens:  Ragweed.  Feathers.  Animal dander.  Grass and tree pollens.  Cigarette smoke.  House dust.  Pollution. SYMPTOMS   Sneezing.  Runny or stuffy nose.  Tearing eyes.  Itchy eyes, nose, mouth, throat, skin, or other area.  Sore throat.  Headache.  Decreased sense of smell or taste. DIAGNOSIS Your caregiver will perform a physical exam and ask questions about the symptoms you are having.Allergy testing may be done to determine exactly what triggers your hay fever.  TREATMENT   Over-the-counter medicines may help symptoms. These  include:  Antihistamines.  Decongestants. These may help with nasal congestion.  Your caregiver may prescribe medicines if over-the-counter medicines do not work.  Some people benefit from allergy shots when other medicines are not helpful. HOME CARE INSTRUCTIONS   Avoid the allergen that is causing your symptoms, if possible.  Take all medicine as told by your caregiver. SEEK MEDICAL CARE IF:   You  have severe allergy symptoms and your current medicines are not helping.  Your treatment was working at one time, but you are now experiencing symptoms.  You have sinus congestion and pressure.  You develop a fever or headache.  You have thick nasal discharge.  You have asthma and have a worsening cough and wheezing. SEEK IMMEDIATE MEDICAL CARE IF:   You have swelling of your tongue or lips.  You have trouble breathing.  You feel lightheaded or like you are going to faint.  You have cold sweats.  You have a fever. Document Released: 11/30/2005 Document Revised: 02/22/2012 Document Reviewed: 02/25/2011 [facility] Patient Information 2015 [place], [place]. This information is not intended to replace advice given to you by your health care provider. Make sure you discuss any questions you have with your health care provider.

## 2015-03-19 NOTE — ED Provider Notes (Signed)
CSN: [id number]     Arrival date & time 03/19/15  1105 History   First MD Initiated Contact with Patient 03/19/15 1153     Chief Complaint  Patient presents with  . Asthma   (Consider location/radiation/quality/duration/timing/severity/associated sxs/prior Treatment) HPI Comments: Patient [place] [facility] and has recently run out of her medications used to control her chronic asthma. States she is also having difficulty with environmental allergies as well. Began wheezing and coughing a few days ago. Denies fever or chest pain.   Patient is a 39 y.o. female presenting with asthma. The history is provided by the patient.  Asthma Associated symptoms include shortness of breath.    Past Medical History  Diagnosis Date  . Allergy   . Asthma    Past Surgical History  Procedure Laterality Date  . No past surgeries     Family History  Problem Relation Age of Onset  . Diabetes Neg Hx   . Hypertension Neg Hx   . Hyperlipidemia Neg Hx   . Cancer Neg Hx    History  Substance Use Topics  . Smoking status: Never Smoker   . Smokeless tobacco: Never Used     Comment: divorced, final 2011. Lives with her 3 sons 16-8-4. 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 [place] [address]  . Alcohol Use: 0.0 oz/week    0 Standard drinks or equivalent per week     Comment: Social   OB History    Gravida Para Term Preterm AB TAB SAB Ectopic Multiple Living   3 3 3       3      Review of Systems  Constitutional: Negative.   HENT: Positive for congestion, postnasal drip and rhinorrhea. Negative for sore throat.   Eyes: Negative.   Respiratory: Positive for cough, chest tightness, shortness of breath and wheezing.   Cardiovascular: Negative.   Gastrointestinal: Negative.   Skin: Negative.     Allergies  Review of patient's allergies indicates no known allergies.  Home Medications   Prior to Admission  medications   Medication Sig Start Date End Date Taking? Authorizing Provider  albuterol-ipratropium (COMBIVENT) 18-103 MCG/ACT inhaler Inhale 2 puffs into the lungs every 4 (four) hours as needed. Shortness of breath, March and April in particular   Yes Historical Provider, MD  albuterol (PROVENTIL HFA;VENTOLIN HFA) 108 (90 BASE) MCG/ACT inhaler Inhale 1-2 puffs into the lungs every 6 (six) hours as needed for wheezing or shortness of breath. 03/19/15   Mathis FareJennifer Lee H Chrstopher Malenfant, PA  fluticasone (FLONASE) 50 MCG/ACT nasal spray Place 2 sprays into both nostrils daily. Begin using after oral prednisone taper has finished 03/19/15   Ria ClockJennifer Lee H Ashtan Laton, PA  levonorgestrel (MIRENA) 20 MCG/24HR IUD 1 each by Intrauterine route once.    Historical Provider, MD  loratadine (CLARITIN) 10 MG tablet Take 1 tablet (10 mg total) by mouth daily. 03/19/15   Mathis FareJennifer Lee H Annelle Behrendt, PA  phenazopyridine (PYRIDIUM) 200 MG tablet Take 1 tablet (200 mg total) by mouth 3 (three) times daily as needed for pain. Patient not taking: Reported on 03/19/2015 03/06/15   Brock Badharles A Harper, MD  predniSONE (DELTASONE) 10 MG tablet Beginning 03/20/2015, take 5 tabs po QD day 1, 4 tabs po QD day 2, 3 tabs po Qd day 3, 2 tabs po QD day 4, 1 tab po QD day 5, then stop 03/19/15   Mathis FareJennifer Lee H Ransome Helwig, PA   BP 105/76  mmHg  Pulse 73  Temp(Src) 98.7 F (37.1 C) (Oral)  Resp 16  SpO2 97% Physical Exam  Constitutional: She is oriented to person, place, and time. She appears well-developed and well-nourished. No distress.  HENT:  Head: Normocephalic and atraumatic.  Right Ear: Hearing and external ear normal.  Left Ear: Hearing and external ear normal.  Nose: Mucosal edema present.  Mouth/Throat: Uvula is midline, oropharynx is clear and moist and mucous membranes are normal.  Eyes: Conjunctivae are normal.  Neck: Normal range of motion. Neck supple.  Cardiovascular: Normal rate, regular rhythm and normal heart sounds.   Pulmonary/Chest:  Effort normal. No stridor. She has wheezes.  Musculoskeletal: Normal range of motion.  Neurological: She is alert and oriented to person, place, and time.  Skin: Skin is warm and dry.  Psychiatric: She has a normal mood and affect. Her behavior is normal.  Nursing note and vitals reviewed.   ED Course  Procedures (including critical care time) Labs Review Labs Reviewed - No data to display  Imaging Review No results found.   MDM   1. Seasonal allergies   2. Bronchospasm   Patient given  oral prednisone, 5 mg albuterol and 0.5mg  Atrovent neb treatment while at [facility] and reports improvement with regard to work of breathing. No hypoxia or respiratory distress. Will continue 5 day prednisone taper at home along with antihistamines and bronchodilator medication. Advised to establish care with PCP. ER re-evaluation if increased work of breathing despite albuterol or additional symptoms worsen or arise.     Ria Clock, [place] 03/19/15 1355

## 2015-03-19 NOTE — ED Notes (Signed)
At discharge, patient questioned what to do for burning in chest and upper back.  brought questions to [place], pa.  Nedra HaiLee, pa will speak with patient.

## 2015-03-19 NOTE — ED Notes (Signed)
Skin warm and dry.  Patient speaking softly and in phrases.  says this is because she is having difficulty breathing and hurts to cough.  Auscultated breath sounds: diminished/absent in bases, attempts at deep inspiration causes patient to cough, ineffective cough

## 2015-03-19 NOTE — ED Notes (Signed)
Reports history of asthma, new to [place] .  Out of inhaler, patient new to seasonal allergies of this area

## 2015-03-26 ENCOUNTER — Ambulatory Visit: Payer: Medicaid Other | Admitting: Certified Nurse Midwife

## 2015-03-27 ENCOUNTER — Telehealth: Payer: Self-pay | Admitting: *Deleted

## 2015-03-27 DIAGNOSIS — N760 Acute vaginitis: Secondary | ICD-10-CM

## 2015-03-27 DIAGNOSIS — B9689 Other specified bacterial agents as the cause of diseases classified elsewhere: Secondary | ICD-10-CM

## 2015-03-27 MED ORDER — METRONIDAZOLE 500 MG PO TABS
500.0000 mg | ORAL_TABLET | Freq: Two times a day (BID) | ORAL | Status: DC
Start: 2015-03-27 — End: 2015-05-03

## 2015-03-27 NOTE — Telephone Encounter (Signed)
Patient thinks she has a yeast infection. 12:09 Call to patient- patient states she is doing much better after her treatment for the UTI. Patient states she now has a discharge with an odor. After reviewing the difference in yeast/BV symptoms- the patient is sure she has BV. Patient request treatment. Per protocol treatment sent to pharmacy- patient advised to follow up in office if symptoms do not improve.

## 2015-05-02 ENCOUNTER — Ambulatory Visit: Payer: Self-pay | Admitting: Certified Nurse Midwife

## 2015-05-03 ENCOUNTER — Encounter: Payer: Self-pay | Admitting: Certified Nurse Midwife

## 2015-05-03 ENCOUNTER — Ambulatory Visit (INDEPENDENT_AMBULATORY_CARE_PROVIDER_SITE_OTHER): Payer: Medicaid Other | Admitting: Certified Nurse Midwife

## 2015-05-03 VITALS — BP 103/71 | HR 74 | Temp 98.5°F | Ht 66.5 in | Wt 166.0 lb

## 2015-05-03 DIAGNOSIS — K5901 Slow transit constipation: Secondary | ICD-10-CM

## 2015-05-03 DIAGNOSIS — Z0000 Encounter for general adult medical examination without abnormal findings: Secondary | ICD-10-CM | POA: Diagnosis not present

## 2015-05-03 DIAGNOSIS — IMO0002 Reserved for concepts with insufficient information to code with codable children: Secondary | ICD-10-CM | POA: Insufficient documentation

## 2015-05-03 DIAGNOSIS — K5909 Other constipation: Secondary | ICD-10-CM

## 2015-05-03 DIAGNOSIS — Z01419 Encounter for gynecological examination (general) (routine) without abnormal findings: Secondary | ICD-10-CM | POA: Diagnosis not present

## 2015-05-03 DIAGNOSIS — K5900 Constipation, unspecified: Secondary | ICD-10-CM | POA: Diagnosis not present

## 2015-05-03 DIAGNOSIS — F419 Anxiety disorder, unspecified: Secondary | ICD-10-CM

## 2015-05-03 LAB — CBC WITH DIFFERENTIAL/PLATELET
Basophils Absolute: 0.1 10*3/uL (ref 0.0–0.1)
Basophils Relative: 1 % (ref 0–1)
Eosinophils Absolute: 0.3 10*3/uL (ref 0.0–0.7)
Eosinophils Relative: 5 % (ref 0–5)
HCT: 39.1 % (ref 36.0–46.0)
Hemoglobin: 13.5 g/dL (ref 12.0–15.0)
Lymphocytes Relative: 39 % (ref 12–46)
Lymphs Abs: 2.7 10*3/uL (ref 0.7–4.0)
MCH: 29.9 pg (ref 26.0–34.0)
MCHC: 34.5 g/dL (ref 30.0–36.0)
MCV: 86.5 fL (ref 78.0–100.0)
MPV: 10.3 fL (ref 8.6–12.4)
Monocytes Absolute: 0.9 10*3/uL (ref 0.1–1.0)
Monocytes Relative: 13 % — ABNORMAL HIGH (ref 3–12)
Neutro Abs: 2.9 10*3/uL (ref 1.7–7.7)
Neutrophils Relative %: 42 % — ABNORMAL LOW (ref 43–77)
Platelets: 213 10*3/uL (ref 150–400)
RBC: 4.52 MIL/uL (ref 3.87–5.11)
RDW: 14.3 % (ref 11.5–15.5)
WBC: 6.8 10*3/uL (ref 4.0–10.5)

## 2015-05-03 LAB — COMPREHENSIVE METABOLIC PANEL
ALT: 12 U/L (ref 0–35)
AST: 16 U/L (ref 0–37)
Albumin: 3.9 g/dL (ref 3.5–5.2)
Alkaline Phosphatase: 52 U/L (ref 39–117)
BUN: 10 mg/dL (ref 6–23)
CO2: 26 mEq/L (ref 19–32)
Calcium: 9 mg/dL (ref 8.4–10.5)
Chloride: 102 mEq/L (ref 96–112)
Creat: 0.63 mg/dL (ref 0.50–1.10)
Glucose, Bld: 85 mg/dL (ref 70–99)
Potassium: 4.1 mEq/L (ref 3.5–5.3)
Sodium: 137 mEq/L (ref 135–145)
Total Bilirubin: 0.9 mg/dL (ref 0.2–1.2)
Total Protein: 7.3 g/dL (ref 6.0–8.3)

## 2015-05-03 LAB — TRIGLYCERIDES: Triglycerides: 50 mg/dL (ref <150)

## 2015-05-03 LAB — CHOLESTEROL, TOTAL: Cholesterol: 170 mg/dL (ref 0–200)

## 2015-05-03 LAB — HEPATITIS B SURFACE ANTIGEN: Hepatitis B Surface Ag: NEGATIVE

## 2015-05-03 LAB — HEPATITIS C ANTIBODY: HCV Ab: NEGATIVE

## 2015-05-03 LAB — HDL CHOLESTEROL: HDL: 90 mg/dL (ref >=46)

## 2015-05-03 LAB — TSH: TSH: 0.86 u[IU]/mL (ref 0.350–4.500)

## 2015-05-03 MED ORDER — ALPRAZOLAM 1 MG PO TABS: 1.0000 mg | ORAL_TABLET | Freq: Every evening | ORAL | Status: DC | PRN

## 2015-05-03 MED ORDER — CITRANATAL HARMONY 30-1-260 MG PO CAPS: 1.0000 | ORAL_CAPSULE | Freq: Every day | ORAL | Status: DC

## 2015-05-03 MED ORDER — SENNOSIDES-DOCUSATE SODIUM 8.6-50 MG PO TABS: 2.0000 | ORAL_TABLET | Freq: Every day | ORAL | Status: DC | PRN
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
## 2015-05-04 LAB — HIV ANTIBODY (ROUTINE TESTING W REFLEX): HIV 1&2 Ab, 4th Generation: NONREACTIVE

## 2015-05-04 LAB — RPR

## 2015-05-07 ENCOUNTER — Encounter: Payer: Self-pay | Admitting: Internal Medicine

## 2015-05-07 LAB — PAP IG AND HPV HIGH-RISK: HPV DNA High Risk: NOT DETECTED

## 2015-05-08 LAB — SURESWAB, VAGINOSIS/VAGINITIS PLUS
Atopobium vaginae: NOT DETECTED Log (cells/mL)
C. albicans, DNA: NOT DETECTED
C. glabrata, DNA: NOT DETECTED
C. parapsilosis, DNA: NOT DETECTED
C. trachomatis RNA, TMA: NOT DETECTED
C. tropicalis, DNA: NOT DETECTED
Gardnerella vaginalis: <4.7 Log (cells/mL)
LACTOBACILLUS SPECIES: >8 Log (cells/mL)
MEGASPHAERA SPECIES: NOT DETECTED Log (cells/mL)
N. gonorrhoeae RNA, TMA: NOT DETECTED
T. vaginalis RNA, QL TMA: NOT DETECTED

## 2015-05-23 ENCOUNTER — Telehealth: Payer: Self-pay | Admitting: *Deleted

## 2015-05-23 ENCOUNTER — Other Ambulatory Visit: Payer: Self-pay | Admitting: Certified Nurse Midwife

## 2015-05-23 DIAGNOSIS — F418 Other specified anxiety disorders: Secondary | ICD-10-CM

## 2015-05-23 MED ORDER — BUPROPION HCL ER (SR) 150 MG PO TB12: 150.0000 mg | ORAL_TABLET | Freq: Two times a day (BID) | ORAL | Status: DC

## 2015-05-23 NOTE — Telephone Encounter (Signed)
Please let her know I put Stephanie Moore on Wellbutrin.  We can try other meds after 4-6 weeks of treatment if that one is not working.  Thank you.

## 2015-05-23 NOTE — Telephone Encounter (Signed)
Stephanie Moore is calling report on our patient: Stephanie Moore   Stephanie Moore met with her yesterday and did her intake interview. She is dealing with a lot of stressors due to divorce and custody which are causing her some depression- but mostly anxiety. The patient is willing to try anitdepressant. She has a follow up with Ms. Smith on 6/20 4:00. Please call the patient to discuss what medication would be appropriate and let Stephanie Moore know the plan. Contact # [phone].

## 2015-05-28 ENCOUNTER — Telehealth: Payer: Self-pay | Admitting: Obstetrics

## 2015-05-29 ENCOUNTER — Ambulatory Visit: Payer: Medicaid Other | Admitting: Certified Nurse Midwife

## 2015-06-04 NOTE — Telephone Encounter (Signed)
Close encounter 

## 2015-07-08 ENCOUNTER — Ambulatory Visit: Payer: Medicaid Other | Admitting: Internal Medicine

## 2015-08-27 ENCOUNTER — Encounter: Payer: Self-pay | Admitting: Certified Nurse Midwife

## 2015-08-27 ENCOUNTER — Ambulatory Visit (INDEPENDENT_AMBULATORY_CARE_PROVIDER_SITE_OTHER): Payer: Medicaid Other | Admitting: Certified Nurse Midwife

## 2015-08-27 VITALS — BP 103/73 | HR 89 | Temp 98.2°F | Ht 66.5 in | Wt 165.0 lb

## 2015-08-27 DIAGNOSIS — N904 Leukoplakia of vulva: Secondary | ICD-10-CM
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
## 2015-09-03 ENCOUNTER — Other Ambulatory Visit: Payer: Self-pay | Admitting: Certified Nurse Midwife

## 2015-09-03 DIAGNOSIS — F419 Anxiety disorder, unspecified: Secondary | ICD-10-CM

## 2015-09-03 MED ORDER — ALPRAZOLAM 1 MG PO TABS: 1.0000 mg | ORAL_TABLET | Freq: Every evening | ORAL | Status: DC | PRN

## 2016-01-09 ENCOUNTER — Telehealth: Payer: Self-pay | Admitting: *Deleted

## 2016-01-09 NOTE — Telephone Encounter (Signed)
Patient states she is having side effects with the Xanax- she is stuttering and "just can't keep it together anymore" 3:50 Call to patient -she states she does not take it every night- but she just can't concentrate at work. I advised her to stop using it because of the side effects she is having and I will have Rachelle call her to discuss her other options.

## 2016-01-10 ENCOUNTER — Other Ambulatory Visit: Payer: Self-pay | Admitting: Certified Nurse Midwife

## 2016-01-10 DIAGNOSIS — F419 Anxiety disorder, unspecified: Principal | ICD-10-CM

## 2016-01-10 DIAGNOSIS — F32A Depression, unspecified: Secondary | ICD-10-CM

## 2016-01-10 DIAGNOSIS — F329 Major depressive disorder, single episode, unspecified: Secondary | ICD-10-CM

## 2016-01-10 MED ORDER — BUSPIRONE HCL 15 MG PO TABS: 15.0000 mg | ORAL_TABLET | Freq: Three times a day (TID) | ORAL | Status: DC

## 2016-01-10 NOTE — Telephone Encounter (Signed)
Please let her know that I have sent in a referral for her to psychology.  Lesle Reek will be calling her.  Please have her stop the xanax.  I have also sent in Buspirone for her to take with the Wellbutrin for the anxiety.  Thank you.  R.Robertt Buda CNM

## 2016-01-14 NOTE — Telephone Encounter (Signed)
LM on VM regarding the new medication- request patient call back to speak to me or Rachelle regarding the plan for referral.

## 2016-01-15 ENCOUNTER — Other Ambulatory Visit: Payer: Self-pay | Admitting: Certified Nurse Midwife

## 2016-01-30 ENCOUNTER — Encounter (HOSPITAL_COMMUNITY): Payer: Self-pay | Admitting: *Deleted

## 2016-01-30 ENCOUNTER — Inpatient Hospital Stay (HOSPITAL_COMMUNITY)
Admission: AD | Admit: 2016-01-30 | Discharge: 2016-01-30 | Disposition: A | Discharge status: Home / Self Care | Payer: Medicaid Other | Source: Ambulatory Visit | Attending: Obstetrics | Admitting: Obstetrics

## 2016-01-30 DIAGNOSIS — J45909 Unspecified asthma, uncomplicated: Secondary | ICD-10-CM | POA: Insufficient documentation

## 2016-01-30 DIAGNOSIS — R109 Unspecified abdominal pain: Secondary | ICD-10-CM | POA: Diagnosis not present

## 2016-01-30 DIAGNOSIS — K529 Noninfective gastroenteritis and colitis, unspecified: Secondary | ICD-10-CM | POA: Diagnosis not present

## 2016-01-30 DIAGNOSIS — R1030 Lower abdominal pain, unspecified: Secondary | ICD-10-CM | POA: Diagnosis present

## 2016-01-30 LAB — URINE MICROSCOPIC-ADD ON

## 2016-01-30 LAB — COMPREHENSIVE METABOLIC PANEL
ALT: 13 U/L — ABNORMAL LOW (ref 14–54)
AST: 17 U/L (ref 15–41)
Albumin: 4.1 g/dL (ref 3.5–5.0)
Alkaline Phosphatase: 48 U/L (ref 38–126)
Anion gap: 5 (ref 5–15)
BUN: 14 mg/dL (ref 6–20)
CO2: 26 mmol/L (ref 22–32)
Calcium: 8.5 mg/dL — ABNORMAL LOW (ref 8.9–10.3)
Chloride: 106 mmol/L (ref 101–111)
Creatinine, Ser: 0.63 mg/dL (ref 0.44–1.00)
GFR calc Af Amer: >60 mL/min (ref >60)
GFR calc non Af Amer: >60 mL/min (ref >60)
Glucose, Bld: 102 mg/dL — ABNORMAL HIGH (ref 65–99)
Potassium: 3.8 mmol/L (ref 3.5–5.1)
Sodium: 137 mmol/L (ref 135–145)
Total Bilirubin: 0.6 mg/dL (ref 0.3–1.2)
Total Protein: 7.8 g/dL (ref 6.5–8.1)

## 2016-01-30 LAB — URINALYSIS, ROUTINE W REFLEX MICROSCOPIC
Bilirubin Urine: NEGATIVE
Glucose, UA: NEGATIVE mg/dL
Ketones, ur: NEGATIVE mg/dL
Leukocytes, UA: NEGATIVE
Nitrite: NEGATIVE
Protein, ur: NEGATIVE mg/dL
Specific Gravity, Urine: 1.02 (ref 1.005–1.030)
pH: 6.5 (ref 5.0–8.0)

## 2016-01-30 LAB — CBC
HCT: 34 % — ABNORMAL LOW (ref 36.0–46.0)
Hemoglobin: 12.1 g/dL (ref 12.0–15.0)
MCH: 29.7 pg (ref 26.0–34.0)
MCHC: 35.6 g/dL (ref 30.0–36.0)
MCV: 83.5 fL (ref 78.0–100.0)
Platelets: 210 10*3/uL (ref 150–400)
RBC: 4.07 MIL/uL (ref 3.87–5.11)
RDW: 13.3 % (ref 11.5–15.5)
WBC: 7.1 10*3/uL (ref 4.0–10.5)

## 2016-01-30 LAB — AMYLASE: Amylase: 123 U/L — ABNORMAL HIGH (ref 28–100)

## 2016-01-30 LAB — LIPASE, BLOOD: Lipase: 17 U/L (ref 11–51)

## 2016-01-30 MED ORDER — GI COCKTAIL ~~LOC~~
30.0000 mL | ORAL | Status: AC
  Administered 2016-01-30: 30 mL via ORAL
  Filled 2016-01-30: qty 30

## 2016-01-30 MED ORDER — SODIUM CHLORIDE 0.9 % IV BOLUS (SEPSIS)
1000.0000 mL | Freq: Once | INTRAVENOUS | Status: AC
  Administered 2016-01-30: 1000 mL via INTRAVENOUS

## 2016-01-30 MED ORDER — HYDROMORPHONE HCL 1 MG/ML IJ SOLN
1.0000 mg | Freq: Once | INTRAMUSCULAR | Status: AC
  Administered 2016-01-30: 1 mg via INTRAVENOUS
  Filled 2016-01-30: qty 1

## 2016-01-30 MED ORDER — ONDANSETRON HCL 4 MG/2ML IJ SOLN
4.0000 mg | Freq: Once | INTRAMUSCULAR | Status: AC
  Administered 2016-01-30: 4 mg via INTRAVENOUS
  Filled 2016-01-30: qty 2

## 2016-01-30 NOTE — MAU Note (Signed)
Pt C/O sudden onset of abdominal pain @ 0830 this morning.  Pain started in lower abdomen, now has moved to LUQ, pain is intermittent & very sharp.

## 2016-01-30 NOTE — MAU Provider Note (Signed)
Chief Complaint: Abdominal Pain   First Provider Initiated Contact with Patient 01/30/16 1112      SUBJECTIVE HPI: Stephanie Moore is a 40 y.o. [id number] who presents to maternity admissions reporting acute onset of lower abdominal pain at 8:30 am this morning, with pain radiating into her mid abdomen, then her pain moved to her left mid/upper abdomen and is coming intermittently in waves.  She reports she has never had this pain before. She has not tried anything for the pain yet.  She ate chicken at a restaurant last night and is worried that maybe it has made her sick. She has not had anything to eat yet today.  She also reports onset of nausea with the pain but has not vomited.   She denies vaginal bleeding, vaginal itching/burning, urinary symptoms, h/a, dizziness, or fever/chills.     HPI  Past Medical History  Diagnosis Date  . Allergy   . Asthma    Past Surgical History  Procedure Laterality Date  . Wisdom tooth extraction     Social History   Social History  . Marital Status: Divorced    Spouse Name: N/A  . Number of Children: N/A  . Years of Education: N/A   Occupational History  . Not on file.   Social History Main Topics  . Smoking status: Never Smoker   . Smokeless tobacco: Never Used     Comment: divorced, final 2011. Lives with her 3 sons 16-8-4. 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 Pt born [address], [place] in [place] since 2010  . Alcohol Use: 0.0 oz/week    0 Standard drinks or equivalent per week     Comment: Social  . Drug Use: No  . Sexual Activity:    Partners: Male    Pharmacist, hospital Protection: IUD   Other Topics Concern  . Not on file   Social History Narrative   No current facility-administered medications on file prior to encounter.   Current Outpatient Prescriptions on File Prior to Encounter  Medication Sig Dispense Refill  . albuterol (PROVENTIL HFA;VENTOLIN HFA) 108 (90 BASE) MCG/ACT inhaler Inhale 1-2  puffs into the lungs every 6 (six) hours as needed for wheezing or shortness of breath. 1 Inhaler 1  . levonorgestrel (MIRENA) 20 MCG/24HR IUD 1 each by Intrauterine route once.     No Known Allergies  ROS:  Review of Systems  Constitutional: Negative for fever, chills and fatigue.  Respiratory: Negative for shortness of breath.   Cardiovascular: Negative for chest pain.  Gastrointestinal: Positive for nausea and abdominal pain. Negative for vomiting.  Genitourinary: Negative for dysuria, flank pain, vaginal bleeding, vaginal discharge, difficulty urinating, vaginal pain and pelvic pain.  Neurological: Negative for dizziness and headaches.  Psychiatric/Behavioral: Negative.      I have reviewed patient's Past Medical Hx, Surgical Hx, Family Hx, Social Hx, medications and allergies.   Physical Exam   Patient Vitals for the past 24 hrs:  BP Temp Pulse Resp  01/30/16 1358 108/71 mmHg - 67 18  01/30/16 1019 114/79 mmHg 97.7 F (36.5 C) 68 20   Constitutional: Well-developed, well-nourished female in moderate distress.  Cardiovascular: normal rate Respiratory: normal effort GI: Abd soft, non-tender. No RLQ pain, no rebound tenderness or guarding. Pos BS x 4 MS: Extremities nontender, no edema, normal ROM Neurologic: Alert and oriented x 4.  GU: Neg CVAT.  PELVIC EXAM: Deferred   LAB RESULTS Results for orders placed or performed during the hospital encounter of 01/30/16 (from the past 24  hour(s))  CBC     Status: Abnormal   Collection Time: 01/30/16 11:39 AM  Result Value Ref Range   WBC 7.1 4.0 - 10.5 K/uL   RBC 4.07 3.87 - 5.11 MIL/uL   Hemoglobin 12.1 12.0 - 15.0 g/dL   HCT 54.0 (L) 98.1 - 19.1 %   MCV 83.5 78.0 - 100.0 fL   MCH 29.7 26.0 - 34.0 pg   MCHC 35.6 30.0 - 36.0 g/dL   RDW 47.8 29.5 - 62.1 %   Platelets 210 150 - 400 K/uL  Lipase, blood     Status: None   Collection Time: 01/30/16 11:39 AM  Result Value Ref Range   Lipase 17 11 - 51 U/L  Amylase      Status: Abnormal   Collection Time: 01/30/16 11:39 AM  Result Value Ref Range   Amylase 123 (H) 28 - 100 U/L  Comprehensive metabolic panel     Status: Abnormal   Collection Time: 01/30/16 11:39 AM  Result Value Ref Range   Sodium 137 135 - 145 mmol/L   Potassium 3.8 3.5 - 5.1 mmol/L   Chloride 106 101 - 111 mmol/L   CO2 26 22 - 32 mmol/L   Glucose, Bld 102 (H) 65 - 99 mg/dL   BUN 14 6 - 20 mg/dL   Creatinine, Ser 3.08 0.44 - 1.00 mg/dL   Calcium 8.5 (L) 8.9 - 10.3 mg/dL   Total Protein 7.8 6.5 - 8.1 g/dL   Albumin 4.1 3.5 - 5.0 g/dL   AST 17 15 - 41 U/L   ALT 13 (L) 14 - 54 U/L   Alkaline Phosphatase 48 38 - 126 U/L   Total Bilirubin 0.6 0.3 - 1.2 mg/dL   GFR calc non Af Amer >60 >60 mL/min   GFR calc Af Amer >60 >60 mL/min   Anion gap 5 5 - 15  Urinalysis, Routine w reflex microscopic (not at [facility])     Status: Abnormal   Collection Time: 01/30/16  1:00 PM  Result Value Ref Range   Color, Urine YELLOW YELLOW   APPearance CLEAR CLEAR   Specific Gravity, Urine 1.020 1.005 - 1.030   pH 6.5 5.0 - 8.0   Glucose, UA NEGATIVE NEGATIVE mg/dL   Hgb urine dipstick TRACE (A) NEGATIVE   Bilirubin Urine NEGATIVE NEGATIVE   Ketones, ur NEGATIVE NEGATIVE mg/dL   Protein, ur NEGATIVE NEGATIVE mg/dL   Nitrite NEGATIVE NEGATIVE   Leukocytes, UA NEGATIVE NEGATIVE  Urine microscopic-add on     Status: Abnormal   Collection Time: 01/30/16  1:00 PM  Result Value Ref Range   Squamous Epithelial / LPF 0-5 (A) NONE SEEN   WBC, UA 0-5 0 - 5 WBC/hpf   RBC / HPF 0-5 0 - 5 RBC/hpf   Bacteria, UA MANY (A) NONE SEEN   Urine-Other MUCOUS PRESENT        IMAGING No results found.  MAU Management/MDM: Ordered labs and reviewed results.  IV fluids, Zofran  IV, Dilaudid 1 mg IV, GI Cocktail.  Pt reports complete resolution of nausea and abdominal pain.  Consult Dr Clearance Coots, reviewed lab results and assessment.  Likely GI source for pain, gastroenteritis.  Recommend Zantac BID and increased PO  fluids at home. If pain persists or worsens, see primary care or emergency dept.  Pt stable at time of discharge.  ASSESSMENT 1. Noninfectious gastroenteritis, unspecified   2. Abdominal pain in female patient     PLAN Discharge home   Follow-up Information  Follow up with HARPER,CHARLES A, MD.   Specialty:  Obstetrics and Gynecology   Why:  As needed for Gyn care. Return to emergency room at [facility] or [facility] for upper abdominal pain or MAU for gyn emergencies   Contact information:   [address] [place] [address] [phone]       Sharen Counter Certified Nurse-Midwife 01/30/2016  2:32 PM

## 2016-01-30 NOTE — MAU Note (Signed)
Pt reprots having abd that astarted in her lower abd but is now in her upper mid left quad. Pain feels like somehting is pulling.

## 2016-01-30 NOTE — Discharge Instructions (Signed)
Salmonella Gastroenteritis, Adult Salmonella gastroenteritis occurs when certain bacteria infect the intestines. People usually begin to feel ill within 72 hours after the infection occurs. The illness can last from 2 days to 2 weeks. Elderly and immunocompromised people are at the greatest risk of this infection. Most people recover completely. However, salmonella bacteria can spread from the intestines to the blood and other parts of the body. In rare cases, a person may develop reactive arthritis with pain in the joints, irritation of the eyes, and painful urination. CAUSES  Salmonella gastroenteritis usually occurs after eating food or drinking liquids that are contaminated with salmonella bacteria. Common causes of this contamination include:  Poor personal hygiene.  Poor kitchen hygiene.  Drinking polluted, standing water.  Contact with carriers of the bacteria. Reptiles are strongly associated with the bacteria, but other animals may carry the bacteria as well. SIGNS AND SYMPTOMS   Nausea.  Vomiting.  Abdominal pain or cramps.  Diarrhea, which may be bloody.  Fever.  Headache. DIAGNOSIS  Your health care provider will take your medical history and perform a physical exam. A blood or stool sample may also be taken and tested for the presence of salmonella bacteria. TREATMENT  Often, no treatment is needed. However, you will need to drink plenty of fluids to prevent dehydration. In severe cases, antibiotic medicines may be given to help shorten the illness. HOME CARE INSTRUCTIONS  Drink enough fluids to keep your urine clear or pale yellow. Until your diarrhea, nausea, or vomiting is under control, you should only drink clear liquids. Clear liquids are anything you can see through, such as water, broth, or non-caffeinated tea. Avoid:  Milk.  Fruit juice.  Alcohol.  Extremely hot or cold fluids.  If you do not have an appetite, do not force yourself to eat. However, you  must continue to drink fluids.  If you have an appetite, eat a normal diet unless your health care provider tells you differently.  Eat a variety of complex carbohydrates (rice, wheat, potatoes, bread), lean meats, yogurt, fruits, and vegetables.  Avoid high-fat foods because they are more difficult to digest.  If you are dehydrated, ask your health care provider for specific rehydration instructions. Signs of dehydration may include:  Severe thirst.  Dry lips and mouth.  Dizziness.  Dark urine.  Decreasing urine frequency and amount.  Confusion.  Rapid breathing or pulse.  If you were prescribed an antibiotic medicine, finish it all even if you start to feel better.  Take medicines only as directed by your health care provider. Antidiarrheal medicines are not recommended.  Keep all follow-up visits as directed by your health care provider. PREVENTION  To prevent future salmonella infections:  Handle meat, eggs, seafood, and poultry properly.  Wash your hands and counters thoroughly after handling or preparing meat, eggs, seafood, and poultry.  Always cook meat, eggs, seafood, and poultry thoroughly.  Wash your hands thoroughly after handling animals. SEEK IMMEDIATE MEDICAL CARE IF:   You are unable to keep fluids down.  You have persistent vomiting or diarrhea.  You have abdominal pain that increases or is concentrated in one small area (localized).  Your diarrhea contains increased blood or mucus.  You feel very weak, dizzy, thirsty, or you faint.  You lose a significant amount of weight. Your health care provider can tell you how much weight loss should concern you.  You have a fever. MAKE SURE YOU:   Understand these instructions.  Will watch your condition.  Will get  help right away if you are not doing well or get worse.   This information is not intended to replace advice given to you by your health care provider. Make sure you discuss any  questions you have with your health care provider.   Document Released: 11/27/2000 Document Revised: 04/16/2015 Document Reviewed: 01/28/2012 Elsevier Interactive Patient Education 2016 [employer].  Viral Gastroenteritis Viral gastroenteritis is also known as stomach flu. This condition affects the stomach and intestinal tract. It can cause sudden diarrhea and vomiting. The illness typically lasts 3 to 8 days. Most people develop an immune response that eventually gets rid of the virus. While this natural response develops, the virus can make you quite ill. CAUSES  Many different viruses can cause gastroenteritis, such as rotavirus or noroviruses. You can catch one of these viruses by consuming contaminated food or water. You may also catch a virus by sharing utensils or other personal items with an infected person or by touching a contaminated surface. SYMPTOMS  The most common symptoms are diarrhea and vomiting. These problems can cause a severe loss of body fluids (dehydration) and a body salt (electrolyte) imbalance. Other symptoms may include:  Fever.  Headache.  Fatigue.  Abdominal pain. DIAGNOSIS  Your caregiver can usually diagnose viral gastroenteritis based on your symptoms and a physical exam. A stool sample may also be taken to test for the presence of viruses or other infections. TREATMENT  This illness typically goes away on its own. Treatments are aimed at rehydration. The most serious cases of viral gastroenteritis involve vomiting so severely that you are not able to keep fluids down. In these cases, fluids must be given through an intravenous line (IV). HOME CARE INSTRUCTIONS   Drink enough fluids to keep your urine clear or pale yellow. Drink small amounts of fluids frequently and increase the amounts as tolerated.  Ask your caregiver for specific rehydration instructions.  Avoid:  Foods high in sugar.  Alcohol.  Carbonated  drinks.  Tobacco.  Juice.  Caffeine drinks.  Extremely hot or cold fluids.  Fatty, greasy foods.  Too much intake of anything at one time.  Dairy products until 24 to 48 hours after diarrhea stops.  You may consume probiotics. Probiotics are active cultures of beneficial bacteria. They may lessen the amount and number of diarrheal stools in adults. Probiotics can be found in yogurt with active cultures and in supplements.  Wash your hands well to avoid spreading the virus.  Only take over-the-counter or prescription medicines for pain, discomfort, or fever as directed by your caregiver. Do not give aspirin to children. Antidiarrheal medicines are not recommended.  Ask your caregiver if you should continue to take your regular prescribed and over-the-counter medicines.  Keep all follow-up appointments as directed by your caregiver. SEEK IMMEDIATE MEDICAL CARE IF:   You are unable to keep fluids down.  You do not urinate at least once every 6 to 8 hours.  You develop shortness of breath.  You notice blood in your stool or vomit. This may look like coffee grounds.  You have abdominal pain that increases or is concentrated in one small area (localized).  You have persistent vomiting or diarrhea.  You have a fever.  The patient is a child younger than 3 months, and he or she has a fever.  The patient is a child older than 3 months, and he or she has a fever and persistent symptoms.  The patient is a child older than 3 months, and  he or she has a fever and symptoms suddenly get worse.  The patient is a baby, and he or she has no tears when crying. MAKE SURE YOU:   Understand these instructions.  Will watch your condition.  Will get help right away if you are not doing well or get worse.   This information is not intended to replace advice given to you by your health care provider. Make sure you discuss any questions you have with your health care provider.    Document Released: 11/30/2005 Document Revised: 02/22/2012 Document Reviewed: 09/16/2011 Elsevier Interactive Patient Education [employer]

## 2016-02-01 ENCOUNTER — Emergency Department (HOSPITAL_COMMUNITY)
Admission: EM | Admit: 2016-02-01 | Discharge: 2016-02-01 | Disposition: A | Discharge status: Home / Self Care | Payer: Medicaid Other | Attending: Emergency Medicine | Admitting: Emergency Medicine

## 2016-02-01 ENCOUNTER — Encounter (HOSPITAL_COMMUNITY): Payer: Self-pay | Admitting: *Deleted

## 2016-02-01 DIAGNOSIS — K5901 Slow transit constipation: Secondary | ICD-10-CM | POA: Diagnosis not present

## 2016-02-01 DIAGNOSIS — J45909 Unspecified asthma, uncomplicated: Secondary | ICD-10-CM | POA: Insufficient documentation

## 2016-02-01 DIAGNOSIS — Z79899 Other long term (current) drug therapy: Secondary | ICD-10-CM | POA: Diagnosis not present

## 2016-02-01 DIAGNOSIS — R109 Unspecified abdominal pain: Secondary | ICD-10-CM | POA: Diagnosis present

## 2016-02-01 LAB — COMPREHENSIVE METABOLIC PANEL
ALT: 14 U/L (ref 14–54)
AST: 24 U/L (ref 15–41)
Albumin: 4.5 g/dL (ref 3.5–5.0)
Alkaline Phosphatase: 56 U/L (ref 38–126)
Anion gap: 8 (ref 5–15)
BUN: 12 mg/dL (ref 6–20)
CO2: 25 mmol/L (ref 22–32)
Calcium: 9.4 mg/dL (ref 8.9–10.3)
Chloride: 108 mmol/L (ref 101–111)
Creatinine, Ser: 0.71 mg/dL (ref 0.44–1.00)
GFR calc Af Amer: >60 mL/min (ref >60)
GFR calc non Af Amer: >60 mL/min (ref >60)
Glucose, Bld: 94 mg/dL (ref 65–99)
Potassium: 4.4 mmol/L (ref 3.5–5.1)
Sodium: 141 mmol/L (ref 135–145)
Total Bilirubin: 0.8 mg/dL (ref 0.3–1.2)
Total Protein: 8 g/dL (ref 6.5–8.1)

## 2016-02-01 LAB — CBC WITH DIFFERENTIAL/PLATELET
Basophils Absolute: 0.1 10*3/uL (ref 0.0–0.1)
Basophils Relative: 1 %
Eosinophils Absolute: 0.3 10*3/uL (ref 0.0–0.7)
Eosinophils Relative: 5 %
HCT: 37.7 % (ref 36.0–46.0)
Hemoglobin: 13.3 g/dL (ref 12.0–15.0)
Lymphocytes Relative: 43 %
Lymphs Abs: 2.9 10*3/uL (ref 0.7–4.0)
MCH: 29.9 pg (ref 26.0–34.0)
MCHC: 35.3 g/dL (ref 30.0–36.0)
MCV: 84.7 fL (ref 78.0–100.0)
Monocytes Absolute: 0.7 10*3/uL (ref 0.1–1.0)
Monocytes Relative: 11 %
Neutro Abs: 2.8 10*3/uL (ref 1.7–7.7)
Neutrophils Relative %: 41 %
Platelets: 224 10*3/uL (ref 150–400)
RBC: 4.45 MIL/uL (ref 3.87–5.11)
RDW: 13.1 % (ref 11.5–15.5)
WBC: 6.7 10*3/uL (ref 4.0–10.5)

## 2016-02-01 LAB — LIPASE, BLOOD: Lipase: 20 U/L (ref 11–51)

## 2016-02-01 MED ORDER — HYDROMORPHONE HCL 1 MG/ML IJ SOLN
1.0000 mg | Freq: Once | INTRAMUSCULAR | Status: AC
  Administered 2016-02-01: 1 mg via INTRAVENOUS
  Filled 2016-02-01: qty 1

## 2016-02-01 MED ORDER — FLEET ENEMA 7-19 GM/118ML RE ENEM
1.0000 | ENEMA | Freq: Once | RECTAL | Status: AC
  Administered 2016-02-01: 1 via RECTAL
  Filled 2016-02-01: qty 1

## 2016-02-01 MED ORDER — SODIUM CHLORIDE 0.9 % IV BOLUS (SEPSIS)
1000.0000 mL | Freq: Once | INTRAVENOUS | Status: AC
  Administered 2016-02-01: 1000 mL via INTRAVENOUS

## 2016-02-01 MED ORDER — ONDANSETRON HCL 4 MG/2ML IJ SOLN
4.0000 mg | Freq: Once | INTRAMUSCULAR | Status: AC
  Administered 2016-02-01: 4 mg via INTRAVENOUS
  Filled 2016-02-01: qty 2

## 2016-02-01 MED ORDER — MAGNESIUM CITRATE PO SOLN
1.0000 | Freq: Once | ORAL | Status: AC
  Administered 2016-02-01: 1 via ORAL
  Filled 2016-02-01: qty 296

## 2016-02-01 NOTE — ED Notes (Signed)
Pt seen Wed at [facility] with same abd pain, was told to return to ED if symptoms persisted. Today continues to have abd pain and nausea.

## 2016-02-01 NOTE — ED Notes (Signed)
Pt given fleet enema, encouraged to hold fleet, assisted to [facility], call light in reach and aware to call for assistance.

## 2016-02-01 NOTE — ED Notes (Signed)
Enema results: Pt had bowel movement, black in color and small balls

## 2016-02-01 NOTE — ED Provider Notes (Signed)
CSN: [id number]     Arrival date & time 02/01/16  4098 History   First MD Initiated Contact with Patient 02/01/16 [phone]     Chief Complaint  Patient presents with  . Abdominal Pain     (Consider location/radiation/quality/duration/timing/severity/associated sxs/prior Treatment) HPI   Stephanie Moore is a 40 y.o. female who presents for evaluation of abdominal pain, and no bowel movement, for 4 days. She has nausea without vomiting. She denies fever, chills, weakness or dizziness. She was seen 2 days ago, at the [facility], and instructed to use ranitidine, and Tylenol. This has not helped her discomfort. She reports chronic "irregularity". There are no other no modifying factors.  Past Medical History  Diagnosis Date  . Allergy   . Asthma    Past Surgical History  Procedure Laterality Date  . Wisdom tooth extraction     Family History  Problem Relation Age of Onset  . Diabetes Neg Hx   . Hypertension Neg Hx   . Hyperlipidemia Neg Hx   . Cancer Neg Hx    Social History  Substance Use Topics  . Smoking status: Never Smoker   . Smokeless tobacco: Never Used     Comment: divorced, final 2011. Lives with her 3 sons 16-8-4. 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 Pt born [address], [place] in [place] since 2010  . Alcohol Use: 0.0 oz/week    0 Standard drinks or equivalent per week     Comment: Social   OB History    Gravida Para Term Preterm AB TAB SAB Ectopic Multiple Living   Review of Systems  All other systems reviewed and are negative.     Allergies  Review of patient's allergies indicates no known allergies.  Home Medications   Prior to Admission medications   Medication Sig Start Date End Date Taking? Authorizing Provider  albuterol (PROVENTIL HFA;VENTOLIN HFA) 108 (90 BASE) MCG/ACT inhaler Inhale 1-2 puffs into the lungs every 6 (six) hours as needed for wheezing or shortness of breath. 03/19/15  Yes Ria Clock, PA  levonorgestrel (MIRENA) 20 MCG/24HR IUD 1 each by Intrauterine route once.   Yes Historical Provider, MD   BP 115/84 mmHg  Pulse 69  Temp(Src) 97.6 F (36.4 C) (Oral)  Resp 14  SpO2 100% Physical Exam  Constitutional: She is oriented to person, place, and time. She appears well-developed and well-nourished. She appears distressed (She is uncomfortable).  HENT:  Head: Normocephalic and atraumatic.  Right Ear: External ear normal.  Left Ear: External ear normal.  Eyes: Conjunctivae and EOM are normal. Pupils are equal, round, and reactive to light.  Neck: Normal range of motion and phonation normal. Neck supple.  Cardiovascular: Normal rate, regular rhythm and normal heart sounds.   Pulmonary/Chest: Effort normal and breath sounds normal. She exhibits no bony tenderness.  Abdominal: Soft. There is tenderness (Mild diffuse).  Musculoskeletal: Normal range of motion.  Neurological: She is alert and oriented to person, place, and time. No cranial nerve deficit or sensory deficit. She exhibits normal muscle tone. Coordination normal.  Skin: Skin is warm, dry and intact.  Psychiatric: She has a normal mood and affect. Her behavior is normal. Judgment and thought content normal.  Nursing note and vitals reviewed.   ED Course  Procedures (including critical care time)  Medications  HYDROmorphone (DILAUDID) injection 1 mg (1 mg Intravenous Given 02/01/16 0932)  ondansetron (ZOFRAN) injection 4 mg (  4 mg Intravenous Given 02/01/16 0932)  sodium chloride 0.9 % bolus 1,000 mL (0 mLs Intravenous Stopped 02/01/16 1053)  magnesium citrate solution 1 Bottle (1 Bottle Oral Given 02/01/16 1007)  ondansetron (ZOFRAN) injection 4 mg (4 mg Intravenous Given 02/01/16 1136)  sodium phosphate (FLEET) 7-19 GM/118ML enema 1 enema (1 enema Rectal Given 02/01/16 1405)  HYDROmorphone (DILAUDID) injection 1 mg (1 mg Intravenous Given 02/01/16 1338)    Patient Vitals for the past 24 hrs:  BP Temp  Temp src Pulse Resp SpO2  02/01/16 1502 115/84 mmHg - - 69 14 100 %  02/01/16 1319 110/65 mmHg - - 68 16 99 %  02/01/16 1121 99/63 mmHg 97.6 F (36.4 C) Oral (!) 57 16 96 %  02/01/16 0912 110/75 mmHg 98 F (36.7 C) Oral 73 16 100 %    4:13 PM Reevaluation with update and discussion. After initial assessment and treatment, an updated evaluation reveals she feels better, and has had large volume stool, here in the ED. Findings discussed with the patient, all questions answered.[name] L    Labs Review Labs Reviewed  COMPREHENSIVE METABOLIC PANEL  LIPASE, BLOOD  CBC WITH DIFFERENTIAL/PLATELET    Imaging Review No results found. I have personally reviewed and evaluated these images and lab results as part of my medical decision-making.   EKG Interpretation None      MDM   Final diagnoses:  Slow transit constipation    Evaluation consistent with a complicated constipation. Doubt metabolic instability or seizures bacterial infection.   Nursing Notes Reviewed/ Care Coordinated Applicable Imaging Reviewed Interpretation of Laboratory Data incorporated into ED treatment  The patient appears reasonably screened and/or stabilized for discharge and I doubt any other medical condition or other [facility] requiring further screening, evaluation, or treatment in the ED at this time prior to discharge.  Plan: Home Medications- Miralax; Home Treatments- increase ; return here if the recommended treatment, does not improve the symptoms; Recommended follow up- PCP prn     [name], MD 02/01/16 1626

## 2016-02-01 NOTE — Discharge Instructions (Signed)
Start using MiraLAX one or 2 times a day, until you have a regular bowel movement for several days. After that switch to Colace 100 mg twice a day, for several months.   Constipation, Adult Constipation is when a person has fewer than three bowel movements a week, has difficulty having a bowel movement, or has stools that are dry, hard, or larger than normal. As people grow older, constipation is more common. A low-fiber diet, not taking in enough fluids, and taking certain medicines may make constipation worse.  CAUSES   Certain medicines, such as antidepressants, pain medicine, iron supplements, antacids, and water pills.   Certain diseases, such as diabetes, irritable bowel syndrome (IBS), thyroid disease, or depression.   Not drinking enough water.   Not eating enough fiber-rich foods.   Stress or travel.   Lack of physical activity or exercise.   Ignoring the urge to have a bowel movement.   Using laxatives too much.  SIGNS AND SYMPTOMS   Having fewer than three bowel movements a week.   Straining to have a bowel movement.   Having stools that are hard, dry, or larger than normal.   Feeling full or bloated.   Pain in the lower abdomen.   Not feeling relief after having a bowel movement.  DIAGNOSIS  Your health care provider will take a medical history and perform a physical exam. Further testing may be done for severe constipation. Some tests may include:  A barium enema X-ray to examine your rectum, colon, and, sometimes, your small intestine.   A sigmoidoscopy to examine your lower colon.   A colonoscopy to examine your entire colon. TREATMENT  Treatment will depend on the severity of your constipation and what is causing it. Some dietary treatments include drinking more fluids and eating more fiber-rich foods. Lifestyle treatments may include regular exercise. If these diet and lifestyle recommendations do not help, your health care provider may  recommend taking over-the-counter laxative medicines to help you have bowel movements. Prescription medicines may be prescribed if over-the-counter medicines do not work.  HOME CARE INSTRUCTIONS   Eat foods that have a lot of fiber, such as fruits, vegetables, whole grains, and beans.  Limit foods high in fat and processed sugars, such as french fries, hamburgers, cookies, candies, and soda.   A fiber supplement may be added to your diet if you cannot get enough fiber from foods.   Drink enough fluids to keep your urine clear or pale yellow.   Exercise regularly or as directed by your health care provider.   Go to the restroom when you have the urge to go. Do not hold it.   Only take over-the-counter or prescription medicines as directed by your health care provider. Do not take other medicines for constipation without talking to your health care provider first.  SEEK IMMEDIATE MEDICAL CARE IF:   You have bright red blood in your stool.   Your constipation lasts for more than 4 days or gets worse.   You have abdominal or rectal pain.   You have thin, pencil-like stools.   You have unexplained weight loss. MAKE SURE YOU:   Understand these instructions.  Will watch your condition.  Will get help right away if you are not doing well or get worse.   This information is not intended to replace advice given to you by your health care provider. Make sure you discuss any questions you have with your health care provider.   Document Released: 08/28/2004  Document Revised: 12/21/2014 Document Reviewed: 09/11/2013 Elsevier Interactive Patient Education 2016 Elsevier Inc.  High-Fiber Diet Fiber, also called dietary fiber, is a type of carbohydrate found in fruits, vegetables, whole grains, and beans. A high-fiber diet can have many health benefits. Your health care provider may recommend a high-fiber diet to help:  Prevent constipation. Fiber can make your bowel movements  more regular.  Lower your cholesterol.  Relieve hemorrhoids, uncomplicated diverticulosis, or irritable bowel syndrome.  Prevent overeating as part of a weight-loss plan.  Prevent heart disease, type 2 diabetes, and certain cancers. WHAT IS MY PLAN? The recommended daily intake of fiber includes:  38 grams for men under age 74.  30 grams for men over age 56.  25 grams for women under age 63.  21 grams for women over age 40. You can get the recommended daily intake of dietary fiber by eating a variety of fruits, vegetables, grains, and beans. Your health care provider may also recommend a fiber supplement if it is not possible to get enough fiber through your diet. WHAT DO I NEED TO KNOW ABOUT A HIGH-FIBER DIET?  Fiber supplements have not been widely studied for their effectiveness, so it is better to get fiber through food sources.  Always check the fiber content on thenutrition facts label of any prepackaged food. Look for foods that contain at least 5 grams of fiber per serving.  Ask your dietitian if you have questions about specific foods that are related to your condition, especially if those foods are not listed in the following section.  Increase your daily fiber consumption gradually. Increasing your intake of dietary fiber too quickly may cause bloating, cramping, or gas.  Drink plenty of water. Water helps you to digest fiber. WHAT FOODS CAN I EAT? Grains Whole-grain breads. Multigrain cereal. Oats and oatmeal. Brown rice. Barley. Bulgur wheat. Millet. Bran muffins. Popcorn. Rye wafer crackers. Vegetables Sweet potatoes. Spinach. Kale. Artichokes. Cabbage. Broccoli. Green peas. Carrots. Squash. Fruits Berries. Pears. Apples. Oranges. Avocados. Prunes and raisins. Dried figs. Meats and Other Protein Sources Navy, kidney, pinto, and soy beans. Split peas. Lentils. Nuts and seeds. Dairy Fiber-fortified yogurt. Beverages Fiber-fortified soy milk. Fiber-fortified  orange juice. Other Fiber bars. The items listed above may not be a complete list of recommended foods or beverages. Contact your dietitian for more options. WHAT FOODS ARE NOT RECOMMENDED? Grains White bread. Pasta made with refined flour. White rice. Vegetables Fried potatoes. Canned vegetables. Well-cooked vegetables.  Fruits Fruit juice. Cooked, strained fruit. Meats and Other Protein Sources Fatty cuts of meat. Fried Environmental education officer or fried fish. Dairy Milk. Yogurt. Cream cheese. Sour cream. Beverages Soft drinks. Other Cakes and pastries. Butter and oils. The items listed above may not be a complete list of foods and beverages to avoid. Contact your dietitian for more information. WHAT ARE SOME TIPS FOR INCLUDING HIGH-FIBER FOODS IN MY DIET?  Eat a wide variety of high-fiber foods.  Make sure that half of all grains consumed each day are whole grains.  Replace breads and cereals made from refined flour or white flour with whole-grain breads and cereals.  Replace white rice with brown rice, bulgur wheat, or millet.  Start the day with a breakfast that is high in fiber, such as a cereal that contains at least 5 grams of fiber per serving.  Use beans in place of meat in soups, salads, or pasta.  Eat high-fiber snacks, such as berries, raw vegetables, nuts, or popcorn.   This information is not intended  to replace advice given to you by your health care provider. Make sure you discuss any questions you have with your health care provider.   Document Released: 11/30/2005 Document Revised: 12/21/2014 Document Reviewed: 05/15/2014 Elsevier Interactive Patient Education [employer].

## 2016-02-04 ENCOUNTER — Other Ambulatory Visit: Payer: Self-pay | Admitting: Gastroenterology

## 2016-02-04 DIAGNOSIS — R1012 Left upper quadrant pain: Secondary | ICD-10-CM

## 2016-02-04 DIAGNOSIS — R1031 Right lower quadrant pain: Secondary | ICD-10-CM

## 2016-02-05 ENCOUNTER — Ambulatory Visit
Admission: RE | Admit: 2016-02-05 | Discharge: 2016-02-05 | Disposition: A | Discharge status: Home / Self Care | Payer: Medicaid Other | Source: Ambulatory Visit | Attending: Gastroenterology | Admitting: Gastroenterology

## 2016-02-05 DIAGNOSIS — R1031 Right lower quadrant pain: Secondary | ICD-10-CM

## 2016-02-05 DIAGNOSIS — R1012 Left upper quadrant pain: Secondary | ICD-10-CM

## 2016-02-05 MED ORDER — IOPAMIDOL (ISOVUE-300) INJECTION 61%
100.0000 mL | Freq: Once | INTRAVENOUS | Status: AC | PRN
  Administered 2016-02-05: 100 mL via INTRAVENOUS

## 2016-02-11 ENCOUNTER — Other Ambulatory Visit: Payer: Self-pay | Admitting: Certified Nurse Midwife

## 2016-02-11 DIAGNOSIS — N6310 Unspecified lump in the right breast, unspecified quadrant: Secondary | ICD-10-CM

## 2016-02-12 ENCOUNTER — Other Ambulatory Visit: Payer: Self-pay | Admitting: Certified Nurse Midwife

## 2016-02-12 ENCOUNTER — Telehealth: Payer: Self-pay

## 2016-02-12 DIAGNOSIS — N6310 Unspecified lump in the right breast, unspecified quadrant: Secondary | ICD-10-CM

## 2016-02-12 NOTE — Telephone Encounter (Signed)
patient has appt with [facility] on 02/18/16, at 1:40pm - told patient appt date and time

## 2016-02-13 ENCOUNTER — Other Ambulatory Visit: Payer: Self-pay | Admitting: Certified Nurse Midwife

## 2016-02-18 ENCOUNTER — Other Ambulatory Visit: Payer: Medicaid Other

## 2016-02-18 ENCOUNTER — Other Ambulatory Visit: Payer: Self-pay | Admitting: Gastroenterology

## 2016-02-18 DIAGNOSIS — R1011 Right upper quadrant pain: Secondary | ICD-10-CM

## 2016-02-19 ENCOUNTER — Other Ambulatory Visit: Payer: Medicaid Other

## 2016-02-20 ENCOUNTER — Ambulatory Visit (INDEPENDENT_AMBULATORY_CARE_PROVIDER_SITE_OTHER): Payer: Medicaid Other | Admitting: Certified Nurse Midwife

## 2016-02-20 VITALS — BP 104/73 | HR 93 | Wt 172.0 lb

## 2016-02-20 DIAGNOSIS — F419 Anxiety disorder, unspecified: Secondary | ICD-10-CM | POA: Diagnosis not present

## 2016-02-20 DIAGNOSIS — N760 Acute vaginitis: Secondary | ICD-10-CM

## 2016-02-20 DIAGNOSIS — R102 Pelvic and perineal pain: Secondary | ICD-10-CM | POA: Diagnosis not present

## 2016-02-20 LAB — POCT URINALYSIS DIPSTICK
Bilirubin, UA: NEGATIVE
Blood, UA: 50
Glucose, UA: NEGATIVE
Ketones, UA: NEGATIVE
Leukocytes, UA: NEGATIVE
Nitrite, UA: NEGATIVE
Protein, UA: NEGATIVE
Spec Grav, UA: 1.02
Urobilinogen, UA: NEGATIVE
pH, UA: 5

## 2016-02-20 MED ORDER — ALPRAZOLAM 2 MG PO TABS: 2.0000 mg | ORAL_TABLET | Freq: Every evening | ORAL | Status: DC | PRN

## 2016-02-20 MED ORDER — FLUCONAZOLE 100 MG PO TABS: 100.0000 mg | ORAL_TABLET | Freq: Once | ORAL | Status: DC

## 2016-02-20 MED ORDER — ALPRAZOLAM 1 MG PO TABS: 1.0000 mg | ORAL_TABLET | Freq: Every evening | ORAL | Status: DC | PRN

## 2016-02-20 NOTE — Progress Notes (Signed)
Patient ID: Stephanie Moore, female   DOB: 10/16/1976, 40 y.o.   MRN: [id number]  Chief Complaint  Patient presents with  . Vaginal Bleeding    has been seen recently at ED for abdominal/pelvic, pt has IUD in place and now has vaginal bleeding- in place x 3years    HPI Stephanie Moore is a 40 y.o. female.  Has a hx of abdominal pain with multiple hospital visits.  Recent CT scan 01/2016 was negative, results reviewed.  Diagnostic mammogram/breast US was ordered on 02/12/16 as CT scan showed small cystic liesion.   Presents today with vaginal bleeding with Mirena IUD that she has had placed in 2014.  Was in place on CT scan 01/2016.  Dr. Elnoria HowardHung appointment 02/25/16.  Has not been anything fried, not eating gassy vegetables, is eating fruit.  Has lower right quadrant pain.  Started bleeding 3 days ago, spotting brown-sh in color is happening with urinating.  Not sexually active.    States that she is out of Xanax and that she has used the last of the last prescription that I gave her and would like a higher dose since she tried her cousins and it worked better.  Patient did not know what her cousins dose was.  States she occasionally takes the Miralax.  Educated to take it on a daily basis along with the colace.    HPI  Past Medical History  Diagnosis Date  . Allergy   . Asthma     Past Surgical History  Procedure Laterality Date  . Wisdom tooth extraction      Family History  Problem Relation Age of Onset  . Diabetes Neg Hx   . Hypertension Neg Hx   . Hyperlipidemia Neg Hx   . Cancer Neg Hx     Social History Social History  Substance Use Topics  . Smoking status: Never Smoker   . Smokeless tobacco: Never Used     Comment: divorced, final 2011. Lives with her 3 sons 16-8-4. 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 [address] [place]  . Alcohol Use: 0.0 oz/week    0 Standard drinks or equivalent per week     Comment: Social    No  Known Allergies  Current Outpatient Prescriptions  Medication Sig Dispense Refill  . levonorgestrel (MIRENA) 20 MCG/24HR IUD 1 each by Intrauterine route once.    Marland Kitchen. albuterol (PROVENTIL HFA;VENTOLIN HFA) 108 (90 BASE) MCG/ACT inhaler Inhale 1-2 puffs into the lungs every 6 (six) hours as needed for wheezing or shortness of breath. 1 Inhaler 1  . alprazolam (XANAX) 2 MG tablet Take 1 tablet (2 mg total) by mouth at bedtime as needed for sleep. 30 tablet 0  . fluconazole (DIFLUCAN) 100 MG tablet Take 1 tablet (100 mg total) by mouth once. Repeat dose in 48-72 hour. 3 tablet 0   No current facility-administered medications for this visit.    Review of Systems Review of Systems Constitutional: negative for fatigue and weight loss Respiratory: negative for cough and wheezing Cardiovascular: negative for chest pain, fatigue and palpitations Gastrointestinal: + for abdominal pain and change in bowel habits Genitourinary: + spotting with IUD Integument/breast: negative for nipple discharge Musculoskeletal:negative for myalgias Neurological: negative for gait problems and tremors Behavioral/Psych: negative for abusive relationship, depression Endocrine: negative for temperature intolerance     Blood pressure 104/73, pulse 93, weight 172 lb (78.019 kg), last menstrual period 02/17/2016.  Physical Exam Physical Exam General:   alert  Skin:  no rash or abnormalities  Lungs:   clear to auscultation bilaterally  Heart:   regular rate and rhythm, S1, S2 normal, no murmur, click, rub or gallop  Breasts:   deferred  Abdomen:  normal findings: no organomegaly, soft, non-tender and no hernia  Pelvis:  External genitalia: normal general appearance Urinary system: urethral meatus normal and bladder without fullness, nontender Vaginal: normal without tenderness, induration or masses, + white chunky discharge, + scant brown discharge Cervix: no CMT, strings present on exam Adnexa: normal bimanual  exam Uterus: anteverted and non-tender, normal size Can feel stool in the rectum.      50% of 15 min visit spent on counseling and coordination of care.   Data Reviewed Previous medical hx, meds, labs, ct scan results  Assessment     Chronic constipation Anxiety Irregular bleeding with IUD Vaginitis     Plan    Orders Placed This Encounter  Procedures  . SureSwab, Vaginosis/Vaginitis Plus  . Urine culture  . POCT urinalysis dipstick   Meds ordered this encounter  Medications  . fluconazole (DIFLUCAN) 100 MG tablet    Sig: Take 1 tablet (100 mg total) by mouth once. Repeat dose in 48-72 hour.    Dispense:  3 tablet    Refill:  0  . DISCONTD: ALPRAZolam (XANAX) 1 MG tablet    Sig: Take 1 tablet (1 mg total) by mouth at bedtime as needed for anxiety.    Dispense:  30 tablet    Refill:  0  . alprazolam (XANAX) 2 MG tablet    Sig: Take 1 tablet (2 mg total) by mouth at bedtime as needed for sleep.    Dispense:  30 tablet    Refill:  0     Follow up as needed.

## 2016-02-23 LAB — URINE CULTURE: Colony Count: 10000

## 2016-02-24 LAB — SURESWAB, VAGINOSIS/VAGINITIS PLUS
Atopobium vaginae: NOT DETECTED Log (cells/mL)
BV CATEGORY: UNDETERMINED — AB
C. albicans, DNA: NOT DETECTED
C. glabrata, DNA: NOT DETECTED
C. parapsilosis, DNA: NOT DETECTED
C. trachomatis RNA, TMA: NOT DETECTED
C. tropicalis, DNA: NOT DETECTED
Gardnerella vaginalis: 6.5 Log (cells/mL)
LACTOBACILLUS SPECIES: 7.8 Log (cells/mL)
MEGASPHAERA SPECIES: NOT DETECTED Log (cells/mL)
N. gonorrhoeae RNA, TMA: NOT DETECTED
T. vaginalis RNA, QL TMA: NOT DETECTED

## 2016-02-25 ENCOUNTER — Other Ambulatory Visit: Payer: Self-pay | Admitting: Certified Nurse Midwife

## 2016-02-25 ENCOUNTER — Ambulatory Visit (HOSPITAL_COMMUNITY)
Admission: RE | Admit: 2016-02-25 | Discharge: 2016-02-25 | Disposition: A | Discharge status: Home / Self Care | Payer: Medicaid Other | Source: Ambulatory Visit | Attending: Gastroenterology | Admitting: Gastroenterology

## 2016-02-25 DIAGNOSIS — R1011 Right upper quadrant pain: Secondary | ICD-10-CM | POA: Diagnosis not present

## 2016-02-25 DIAGNOSIS — N390 Urinary tract infection, site not specified: Secondary | ICD-10-CM

## 2016-02-25 MED ORDER — TECHNETIUM TC 99M MEBROFENIN IV KIT
5.0000 | PACK | Freq: Once | INTRAVENOUS | Status: AC | PRN
  Administered 2016-02-25: 5 via INTRAVENOUS

## 2016-02-25 MED ORDER — NITROFURANTOIN MONOHYD MACRO 100 MG PO CAPS: 100.0000 mg | ORAL_CAPSULE | Freq: Two times a day (BID) | ORAL | Status: AC

## 2016-02-25 MED ORDER — PHENAZOPYRIDINE HCL 100 MG PO TABS: 100.0000 mg | ORAL_TABLET | Freq: Three times a day (TID) | ORAL | Status: DC | PRN

## 2016-02-25 MED ORDER — PHENAZOPYRIDINE HCL 95 MG PO TABS: 95.0000 mg | ORAL_TABLET | Freq: Three times a day (TID) | ORAL | Status: DC | PRN

## 2016-02-26 ENCOUNTER — Other Ambulatory Visit: Payer: Self-pay | Admitting: Certified Nurse Midwife

## 2016-03-16 ENCOUNTER — Other Ambulatory Visit: Payer: Self-pay | Admitting: General Surgery

## 2016-03-16 DIAGNOSIS — R1011 Right upper quadrant pain: Secondary | ICD-10-CM

## 2016-03-18 ENCOUNTER — Telehealth: Payer: Self-pay | Admitting: *Deleted

## 2016-03-18 ENCOUNTER — Other Ambulatory Visit: Payer: Self-pay | Admitting: Certified Nurse Midwife

## 2016-03-18 DIAGNOSIS — R102 Pelvic and perineal pain: Secondary | ICD-10-CM

## 2016-03-18 DIAGNOSIS — N760 Acute vaginitis: Secondary | ICD-10-CM

## 2016-03-18 MED ORDER — FLUCONAZOLE 100 MG PO TABS: 100.0000 mg | ORAL_TABLET | Freq: Once | ORAL | Status: DC

## 2016-03-18 MED ORDER — TERCONAZOLE 0.4 % VA CREA: 1.0000 | TOPICAL_CREAM | Freq: Every day | VAGINAL | Status: DC

## 2016-03-18 NOTE — Telephone Encounter (Signed)
Please let her know that Diflucan and terconazole were sent to the pharmacy.  Thank you.  R.Makarios Madlock CNM

## 2016-03-18 NOTE — Telephone Encounter (Signed)
Patient states she was given antibiotics for a UTI- now she has yeast. Can she get yeast treatment?

## 2016-03-20 ENCOUNTER — Ambulatory Visit
Admission: RE | Admit: 2016-03-20 | Discharge: 2016-03-20 | Disposition: A | Discharge status: Home / Self Care | Payer: Medicaid Other | Source: Ambulatory Visit | Attending: General Surgery | Admitting: General Surgery

## 2016-03-20 ENCOUNTER — Telehealth: Payer: Self-pay | Admitting: *Deleted

## 2016-03-20 DIAGNOSIS — R1011 Right upper quadrant pain: Secondary | ICD-10-CM

## 2016-03-20 NOTE — Telephone Encounter (Signed)
Pt called to office stating that she was still having some blood in her urine.  Pt states that she just completed treatment for UTI.   Return call to pt. Pt made aware that she may come by office and leave a urine sample for re culture.   Pt states that she currently is being treated for abd pain as well.  Pt states that she has a CT scan today and will not be able to come by.  Pt states that she will wait for results of her scan and if she is still having problems / urinary symptoms she will contact office. Pt made aware that if she needs an appt to call next week if symptoms do not resolve.

## 2016-03-24 ENCOUNTER — Other Ambulatory Visit: Payer: Medicaid Other

## 2016-04-29 ENCOUNTER — Telehealth: Payer: Self-pay | Admitting: *Deleted

## 2016-04-29 NOTE — Telephone Encounter (Signed)
Patient state she is not doing any better. The medication is not helping. She just walked out of work and did not go back- she lost her job. [facility]. She does have an appointment with [facility] tomorrow afternoon and she will call us to let us know how it goes and what their recommendation is.

## 2016-06-02 ENCOUNTER — Telehealth: Payer: Self-pay | Admitting: *Deleted

## 2016-06-02 NOTE — Telephone Encounter (Signed)
Pt called to office needing recommendations for physician.  Attempt to contact pt regarding call.  No answer, no voicemail.

## 2016-06-29 ENCOUNTER — Emergency Department (HOSPITAL_COMMUNITY)
Admission: EM | Admit: 2016-06-29 | Discharge: 2016-06-29 | Disposition: A | Discharge status: Home / Self Care | Payer: Medicaid Other | Attending: Emergency Medicine | Admitting: Emergency Medicine

## 2016-06-29 ENCOUNTER — Encounter (HOSPITAL_COMMUNITY): Payer: Self-pay | Admitting: *Deleted

## 2016-06-29 DIAGNOSIS — Y939 Activity, unspecified: Secondary | ICD-10-CM | POA: Insufficient documentation

## 2016-06-29 DIAGNOSIS — Y92412 Parkway as the place of occurrence of the external cause: Secondary | ICD-10-CM | POA: Diagnosis not present

## 2016-06-29 DIAGNOSIS — M545 Low back pain: Secondary | ICD-10-CM | POA: Insufficient documentation

## 2016-06-29 DIAGNOSIS — M25562 Pain in left knee: Secondary | ICD-10-CM | POA: Diagnosis not present

## 2016-06-29 DIAGNOSIS — M549 Dorsalgia, unspecified: Secondary | ICD-10-CM | POA: Diagnosis present

## 2016-06-29 DIAGNOSIS — Z79899 Other long term (current) drug therapy: Secondary | ICD-10-CM | POA: Insufficient documentation

## 2016-06-29 DIAGNOSIS — Y999 Unspecified external cause status: Secondary | ICD-10-CM | POA: Diagnosis not present

## 2016-06-29 DIAGNOSIS — J45909 Unspecified asthma, uncomplicated: Secondary | ICD-10-CM | POA: Diagnosis not present

## 2016-06-29 MED ORDER — METHOCARBAMOL 500 MG PO TABS: 500.0000 mg | ORAL_TABLET | Freq: Two times a day (BID) | ORAL | Status: DC

## 2016-06-29 MED ORDER — NAPROXEN 500 MG PO TABS: 500.0000 mg | ORAL_TABLET | Freq: Two times a day (BID) | ORAL | Status: DC
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
## 2016-06-29 NOTE — ED Notes (Signed)
Declined W/C at D/C and was escorted to lobby by RN. 

## 2016-06-30 ENCOUNTER — Other Ambulatory Visit: Payer: Self-pay | Admitting: Certified Nurse Midwife

## 2016-06-30 ENCOUNTER — Telehealth: Payer: Self-pay | Admitting: *Deleted

## 2016-06-30 DIAGNOSIS — F329 Major depressive disorder, single episode, unspecified: Secondary | ICD-10-CM

## 2016-06-30 DIAGNOSIS — F32A Depression, unspecified: Secondary | ICD-10-CM

## 2016-06-30 DIAGNOSIS — F419 Anxiety disorder, unspecified: Principal | ICD-10-CM

## 2016-06-30 MED ORDER — ALPRAZOLAM 2 MG PO TABS: 2.0000 mg | ORAL_TABLET | Freq: Every evening | ORAL | Status: DC | PRN

## 2016-06-30 NOTE — Telephone Encounter (Signed)
Patient notified about Rx. She did see [facility]- but she is having a hard time scheduling with them for times when she is not working or she has the kids. She wants to know if you know of someone else?

## 2016-06-30 NOTE — Telephone Encounter (Signed)
Please let her know that I will have a signed Rx at the front desk for her.  Please ask her if she is going to counseling.  If so she needs to have her counselors start managing her medications.  Thank you.  R.Berania Peedin CNM

## 2016-06-30 NOTE — Telephone Encounter (Signed)
Pt called to request refill on alprazolam Stephanie Moore(XANAX) 2 MG table  Stephanie Moore

## 2016-07-01 ENCOUNTER — Other Ambulatory Visit: Payer: Self-pay | Admitting: Certified Nurse Midwife

## 2016-07-01 DIAGNOSIS — F418 Other specified anxiety disorders: Secondary | ICD-10-CM

## 2016-07-01 NOTE — Telephone Encounter (Signed)
I have sent her a referral.  Stephanie Moore should be calling her with an appointment.  Thank you.

## 2016-07-02 ENCOUNTER — Other Ambulatory Visit: Payer: Self-pay | Admitting: Certified Nurse Midwife

## 2016-07-02 DIAGNOSIS — F419 Anxiety disorder, unspecified: Principal | ICD-10-CM

## 2016-07-02 DIAGNOSIS — F329 Major depressive disorder, single episode, unspecified: Secondary | ICD-10-CM

## 2016-07-02 DIAGNOSIS — F32A Depression, unspecified: Secondary | ICD-10-CM

## 2016-07-02 MED ORDER — ALPRAZOLAM 2 MG PO TABS: 2.0000 mg | ORAL_TABLET | Freq: Every evening | ORAL | Status: DC | PRN

## 2016-07-14 ENCOUNTER — Ambulatory Visit: Payer: Medicaid Other | Admitting: Certified Nurse Midwife

## 2016-08-04 ENCOUNTER — Encounter: Payer: Self-pay | Admitting: Certified Nurse Midwife

## 2016-08-04 ENCOUNTER — Ambulatory Visit (INDEPENDENT_AMBULATORY_CARE_PROVIDER_SITE_OTHER): Payer: Medicaid Other | Admitting: Certified Nurse Midwife

## 2016-08-04 VITALS — BP 104/72 | HR 69 | Ht 66.0 in | Wt 156.0 lb

## 2016-08-04 DIAGNOSIS — Z0000 Encounter for general adult medical examination without abnormal findings: Secondary | ICD-10-CM | POA: Diagnosis not present

## 2016-08-04 DIAGNOSIS — Z01419 Encounter for gynecological examination (general) (routine) without abnormal findings: Secondary | ICD-10-CM

## 2016-08-04 DIAGNOSIS — F32A Depression, unspecified: Secondary | ICD-10-CM

## 2016-08-04 DIAGNOSIS — F419 Anxiety disorder, unspecified: Secondary | ICD-10-CM

## 2016-08-04 DIAGNOSIS — F329 Major depressive disorder, single episode, unspecified: Secondary | ICD-10-CM

## 2016-08-04 DIAGNOSIS — G5601 Carpal tunnel syndrome, right upper limb: Secondary | ICD-10-CM

## 2016-08-04 MED ORDER — ALPRAZOLAM 2 MG PO TABS: 2.0000 mg | ORAL_TABLET | Freq: Every evening | ORAL | 0 refills | Status: DC | PRN
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
## 2016-08-06 LAB — PAP IG AND HPV HIGH-RISK
HPV, high-risk: NEGATIVE
PAP Smear Comment: 0

## 2016-08-07 LAB — NUSWAB VG, CANDIDA 6SP
Candida albicans, NAA: NEGATIVE
Candida glabrata, NAA: NEGATIVE
Candida krusei, NAA: NEGATIVE
Candida lusitaniae, NAA: NEGATIVE
Candida parapsilosis, NAA: NEGATIVE
Candida tropicalis, NAA: NEGATIVE
Trich vag by NAA: NEGATIVE

## 2016-08-25 ENCOUNTER — Ambulatory Visit: Payer: Medicaid Other | Admitting: Certified Nurse Midwife

## 2016-09-15 ENCOUNTER — Other Ambulatory Visit: Payer: Medicaid Other

## 2016-09-17 ENCOUNTER — Ambulatory Visit: Payer: Medicaid Other | Admitting: Certified Nurse Midwife

## 2016-09-17 ENCOUNTER — Encounter: Payer: Self-pay | Admitting: *Deleted

## 2016-09-17 ENCOUNTER — Other Ambulatory Visit (HOSPITAL_COMMUNITY)
Admission: RE | Admit: 2016-09-17 | Discharge: 2016-09-17 | Disposition: A | Discharge status: Home / Self Care | Payer: Medicaid Other | Source: Ambulatory Visit | Attending: Certified Nurse Midwife | Admitting: Certified Nurse Midwife

## 2016-09-17 VITALS — BP 109/76 | HR 72

## 2016-09-17 DIAGNOSIS — Z113 Encounter for screening for infections with a predominantly sexual mode of transmission: Secondary | ICD-10-CM | POA: Diagnosis not present

## 2016-09-17 DIAGNOSIS — Z30433 Encounter for removal and reinsertion of intrauterine contraceptive device: Secondary | ICD-10-CM | POA: Diagnosis not present

## 2016-09-17 DIAGNOSIS — Z30430 Encounter for insertion of intrauterine contraceptive device: Secondary | ICD-10-CM

## 2016-09-17 MED ORDER — OXYCODONE-ACETAMINOPHEN 5-325 MG PO TABS
1.0000 | ORAL_TABLET | ORAL | 0 refills | Status: DC | PRN
Start: 2016-09-17 — End: 2016-09-17

## 2016-09-17 MED ORDER — LEVONORGESTREL 20 MCG/24HR IU IUD: INTRAUTERINE_SYSTEM | Freq: Once | INTRAUTERINE | Status: AC

## 2016-09-17 MED ORDER — OXYCODONE-ACETAMINOPHEN 5-325 MG PO TABS: 1.0000 | ORAL_TABLET | ORAL | 0 refills | Status: DC | PRN

## 2016-09-17 MED ORDER — IBUPROFEN 800 MG PO TABS: 800.0000 mg | ORAL_TABLET | Freq: Three times a day (TID) | ORAL | 2 refills | Status: AC | PRN

## 2016-09-17 MED ORDER — TRAMADOL HCL 50 MG PO TABS: 50.0000 mg | ORAL_TABLET | Freq: Four times a day (QID) | ORAL | 0 refills | Status: DC | PRN

## 2016-09-17 NOTE — Progress Notes (Signed)
MIRENA REMOVAL   Reasons  for removal: Time for change of IUD  A timeout was performed confirming the patient, the procedure and allergy status. The patient was placed in the lithotomy position, a speculum was placed.  Cervix and strings visualized.   Long forceps used in a strile manner.  Stings grasped with long forceps and device removed intact.  The patient tolerated the procedure well.  New contraceptive method: IUD replaced this visit with another Mirena.

## 2016-09-17 NOTE — Progress Notes (Signed)
IUD Procedure Note   DIAGNOSIS: Desires long-term, reversible contraception   PROCEDURE: IUD placement Performing Provider: Orvilla Cornwallachelle Ibeth Fahmy CNM  Patient counseled prior to procedure. I explained risks and benefits of Mirena IUD, reviewed alternative forms of contraception. Patient stated understanding and consented to continue with procedure.   LMP: unknown, current IUD Pregnancy Test: Negative Lot #: [id number] Expiration Date: 03/2019   IUD type: [X]  Mirena   [   ] ParaGard  [  ] Christean GriefSkyla   [    ]  Kyleena  PROCEDURE:  Timeout procedure was performed to ensure right patient and right site.  A bimanual exam was performed to determine the position of the uterus, retroverted. The speculum was placed. The vagina and cervix was sterilized in the usual manner and sterile technique was maintained throughout the course of the procedure. A single toothed tenaculum was not required. The depth of the uterus was sounded to 10 cm. The IUD was inserted to the appropriate depth and inserted without difficulty.  The string was cut to an estimated 4 cm length. Bleeding was minimal. The patient tolerated the procedure well.   Follow up: The patient tolerated the procedure well without complications.  Standard post-procedure care is explained and return precautions are given.  Orvilla Cornwallachelle Zahirah Cheslock CNM

## 2016-09-18 LAB — GC/CHLAMYDIA PROBE AMP (~~LOC~~) NOT AT ARMC
Chlamydia: NEGATIVE
Neisseria Gonorrhea: NEGATIVE

## 2016-09-21 ENCOUNTER — Other Ambulatory Visit: Payer: Medicaid Other

## 2016-09-23 ENCOUNTER — Telehealth: Payer: Self-pay

## 2016-09-23 DIAGNOSIS — B379 Candidiasis, unspecified: Secondary | ICD-10-CM

## 2016-09-23 DIAGNOSIS — N760 Acute vaginitis: Secondary | ICD-10-CM

## 2016-09-23 MED ORDER — FLUCONAZOLE 150 MG PO TABS: 150.0000 mg | ORAL_TABLET | Freq: Once | ORAL | 0 refills | Status: AC

## 2016-09-23 MED ORDER — TERCONAZOLE 0.4 % VA CREA: 1.0000 | TOPICAL_CREAM | Freq: Every day | VAGINAL | 0 refills | Status: DC

## 2016-09-23 NOTE — Telephone Encounter (Signed)
S/w patient and informed that medications would be sent to her pharmacy.

## 2016-12-18 ENCOUNTER — Ambulatory Visit (INDEPENDENT_AMBULATORY_CARE_PROVIDER_SITE_OTHER): Payer: Medicaid Other | Admitting: Obstetrics

## 2016-12-18 ENCOUNTER — Encounter: Payer: Self-pay | Admitting: Obstetrics

## 2016-12-18 VITALS — BP 108/75 | HR 77 | Temp 98.2°F | Ht 67.0 in | Wt 161.0 lb

## 2016-12-18 DIAGNOSIS — F329 Major depressive disorder, single episode, unspecified: Secondary | ICD-10-CM

## 2016-12-18 DIAGNOSIS — N6310 Unspecified lump in the right breast, unspecified quadrant: Secondary | ICD-10-CM | POA: Diagnosis not present

## 2016-12-18 DIAGNOSIS — F419 Anxiety disorder, unspecified: Secondary | ICD-10-CM

## 2016-12-18 DIAGNOSIS — N6315 Unspecified lump in the right breast, overlapping quadrants: Secondary | ICD-10-CM

## 2016-12-18 DIAGNOSIS — F418 Other specified anxiety disorders: Secondary | ICD-10-CM

## 2016-12-18 DIAGNOSIS — F32A Depression, unspecified: Secondary | ICD-10-CM

## 2016-12-18 MED ORDER — ALPRAZOLAM 2 MG PO TABS: 2.0000 mg | ORAL_TABLET | Freq: Every evening | ORAL | 0 refills | Status: DC | PRN

## 2016-12-18 NOTE — Progress Notes (Signed)
Pt present for R breast lump x 72 hours. It is painful, hot to touch, and the size of a lemon. Has never had a mammogram.

## 2016-12-18 NOTE — Progress Notes (Signed)
Patient ID: Stephanie Moore, female   DOB: Mar 16, 1976, 41 y.o.   MRN: [id number]  Chief Complaint  Patient presents with  . Breast Mass    HPI Stephanie Moore is a 41 y.o. female.  Painful mass in right breast.  First noticed mass 3 days ago.  Described as warm to touch and tender. HPI  Past Medical History:  Diagnosis Date  . Allergy   . Asthma     Past Surgical History:  Procedure Laterality Date  . WISDOM TOOTH EXTRACTION      Family History  Problem Relation Age of Onset  . Diabetes Neg Hx   . Hypertension Neg Hx   . Hyperlipidemia Neg Hx   . Cancer Neg Hx     Social History Social History  Substance Use Topics  . Smoking status: Never Smoker  . Smokeless tobacco: Never Used     Comment: divorced, final 2011. Lives with her 3 sons 16-8-4. 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 Pt born [address], [place] in [place] since 2010  . Alcohol use 0.0 oz/week     Comment: Social    No Known Allergies  Current Outpatient Prescriptions  Medication Sig Dispense Refill  . alprazolam (XANAX) 2 MG tablet Take 1 tablet (2 mg total) by mouth at bedtime as needed for sleep. 30 tablet 0  . albuterol (PROVENTIL HFA;VENTOLIN HFA) 108 (90 BASE) MCG/ACT inhaler Inhale 1-2 puffs into the lungs every 6 (six) hours as needed for wheezing or shortness of breath. (Patient not taking: Reported on 12/18/2016) 1 Inhaler 1  . ibuprofen (ADVIL,MOTRIN) 800 MG tablet Take 1 tablet (800 mg total) by mouth every 8 (eight) hours as needed. (Patient not taking: Reported on 12/18/2016) 90 tablet 2  . methocarbamol (ROBAXIN) 500 MG tablet Take 1 tablet (500 mg total) by mouth 2 (two) times daily. (Patient not taking: Reported on 12/18/2016) 20 tablet 0  . naproxen (NAPROSYN) 500 MG tablet Take 1 tablet (500 mg total) by mouth 2 (two) times daily. (Patient not taking: Reported on 12/18/2016) 30 tablet 0  . oxyCODONE-acetaminophen (PERCOCET/ROXICET) 5-325 MG tablet Take 1 tablet by mouth  every 4 (four) hours as needed for severe pain. (Patient not taking: Reported on 12/18/2016) 20 tablet 0  . phenazopyridine (PYRIDIUM) 100 MG tablet Take 1 tablet (100 mg total) by mouth 3 (three) times daily as needed for pain. (Patient not taking: Reported on 12/18/2016) 20 tablet 0  . terconazole (TERAZOL 7) 0.4 % vaginal cream Place 1 applicator vaginally at bedtime. (Patient not taking: Reported on 12/18/2016) 45 g 0  . traMADol (ULTRAM) 50 MG tablet Take 1 tablet (50 mg total) by mouth every 6 (six) hours as needed. (Patient not taking: Reported on 12/18/2016) 60 tablet 0   Current Facility-Administered Medications  Medication Dose Route Frequency Provider Last Rate Last Dose  . levonorgestrel (MIRENA) 20 MCG/24HR IUD   Intrauterine Once Rachelle A Denney, CNM        Review of Systems Review of Systems Constitutional: negative for fatigue and weight loss Respiratory: negative for cough and wheezing Cardiovascular: negative for chest pain, fatigue and palpitations Gastrointestinal: negative for abdominal pain and change in bowel habits Genitourinary:negative Integument/breast: positive for painful mass in right breast Musculoskeletal:negative for myalgias Neurological: negative for gait problems and tremors Behavioral/Psych: negative for abusive relationship, depression Endocrine: negative for temperature intolerance      Blood pressure 108/75, pulse 77, temperature 98.2 F (36.8 C), height 5\' 7"  (1.702 m), weight 161 lb (73 kg).  Physical  Exam Physical Exam General:   alert and no distress  Skin:   no rash or abnormalities  Lungs:   clear to auscultation bilaterally  Heart:   regular rate and rhythm, S1, S2 normal, no murmur, click, rub or gallop  Breasts:   3-4cm mass in right breast, soft, mobile, tender.  Location: 9 o'clock   50% of 15 min visit spent on counseling and coordination of care.    Data Reviewed Labs  Assessment     Breast mass, right side at 9 o'clock, inner  quadrant    Plan    Referred to [facility] for further evaluation Fo;;ow up in 2 weeks.  Orders Placed This Encounter  Procedures  . US BREAST COMPLETE UNI RIGHT INC AXILLA    PF:  NONE PER OFFICE/ NO IMPLANTS/ NO HX BR CA/ NO NEEDS/ EPIC ORDER INS:  MEDICAID  [place]   [id number]    Standing Status:   Future    Standing Expiration Date:   02/15/2018    Order Specific Question:   Reason for Exam (SYMPTOM  OR DIAGNOSIS REQUIRED)    Answer:   Breast lump, right side at 9 o'clock, inner quadrant    Order Specific Question:   Preferred imaging location?    Answer:   [facility]  . MM DIAG BREAST TOMO BILATERAL    PF:  NONE PER OFFICE/ NO IMPLANTS/ NO HX BR CA/ NO NEEDS/ EPIC ORDER INS:  MEDICAID  [place]   [id number]    Standing Status:   Future    Standing Expiration Date:   02/15/2018    Order Specific Question:   Reason for Exam (SYMPTOM  OR DIAGNOSIS REQUIRED)    Answer:   Breast lump, right side at 9 o'clock, inner quadrant    Order Specific Question:   Is the patient pregnant?    Answer:   No    Order Specific Question:   [facility]    [facility]   Meds ordered this encounter  Medications  . alprazolam (XANAX) 2 MG tablet    Sig: Take 1 tablet (2 mg total) by mouth at bedtime as needed for sleep.    Dispense:  30 tablet    Refill:  0

## 2016-12-23 ENCOUNTER — Ambulatory Visit
Admission: RE | Admit: 2016-12-23 | Discharge: 2016-12-23 | Disposition: A | Discharge status: Home / Self Care | Payer: Medicaid Other | Source: Ambulatory Visit | Attending: Obstetrics | Admitting: Obstetrics

## 2016-12-23 ENCOUNTER — Other Ambulatory Visit: Payer: Self-pay | Admitting: Obstetrics

## 2016-12-23 DIAGNOSIS — N6310 Unspecified lump in the right breast, unspecified quadrant: Principal | ICD-10-CM

## 2016-12-23 DIAGNOSIS — N6315 Unspecified lump in the right breast, overlapping quadrants: Secondary | ICD-10-CM

## 2017-01-01 ENCOUNTER — Encounter: Payer: Self-pay | Admitting: Obstetrics

## 2017-01-01 ENCOUNTER — Ambulatory Visit (INDEPENDENT_AMBULATORY_CARE_PROVIDER_SITE_OTHER): Payer: Medicaid Other | Admitting: Obstetrics

## 2017-01-01 VITALS — BP 123/82 | HR 67 | Temp 98.2°F | Ht 67.0 in | Wt 164.0 lb

## 2017-01-01 DIAGNOSIS — N6001 Solitary cyst of right breast: Secondary | ICD-10-CM

## 2017-01-01 DIAGNOSIS — N6315 Unspecified lump in the right breast, overlapping quadrants: Secondary | ICD-10-CM

## 2017-01-01 DIAGNOSIS — N6312 Unspecified lump in the right breast, upper inner quadrant: Secondary | ICD-10-CM

## 2017-01-01 DIAGNOSIS — N6002 Solitary cyst of left breast: Secondary | ICD-10-CM

## 2017-01-01 DIAGNOSIS — N6310 Unspecified lump in the right breast, unspecified quadrant: Secondary | ICD-10-CM

## 2017-01-01 MED ORDER — OXYCODONE-ACETAMINOPHEN 10-325 MG PO TABS
1.0000 | ORAL_TABLET | ORAL | 0 refills | Status: DC | PRN
Start: 2017-01-01 — End: 2017-04-02

## 2017-01-01 MED ORDER — AMOXICILLIN-POT CLAVULANATE 875-125 MG PO TABS: 1.0000 | ORAL_TABLET | Freq: Two times a day (BID) | ORAL | 1 refills | Status: DC

## 2017-01-01 NOTE — Progress Notes (Signed)
Patient ID: Stephanie Moore, female   DOB: 14-Aug-1976, 41 y.o.   MRN: [id number]  Chief Complaint  Patient presents with  . Breast Pain    HPI Stephanie Moore is a 41 y.o. female.  Presents for results of breast ultrasound for painful right breast lump.  States that pain has been worse and now she is running a fever. HPI  Past Medical History:  Diagnosis Date  . Allergy   . Asthma     Past Surgical History:  Procedure Laterality Date  . WISDOM TOOTH EXTRACTION      Family History  Problem Relation Age of Onset  . Diabetes Neg Hx   . Hypertension Neg Hx   . Hyperlipidemia Neg Hx   . Cancer Neg Hx     Social History Social History  Substance Use Topics  . Smoking status: Never Smoker  . Smokeless tobacco: Never Used     Comment: divorced, final 2011. Lives with her 3 sons 16-8-4. 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 Pt born [address], [place] in [place] since 2010  . Alcohol use 0.0 oz/week     Comment: Social    No Known Allergies  Current Outpatient Prescriptions  Medication Sig Dispense Refill  . alprazolam (XANAX) 2 MG tablet Take 1 tablet (2 mg total) by mouth at bedtime as needed for sleep. 30 tablet 0  . ibuprofen (ADVIL,MOTRIN) 800 MG tablet Take 1 tablet (800 mg total) by mouth every 8 (eight) hours as needed. 90 tablet 2  . albuterol (PROVENTIL HFA;VENTOLIN HFA) 108 (90 BASE) MCG/ACT inhaler Inhale 1-2 puffs into the lungs every 6 (six) hours as needed for wheezing or shortness of breath. (Patient not taking: Reported on 12/18/2016) 1 Inhaler 1  . amoxicillin-clavulanate (AUGMENTIN) 875-125 MG tablet Take 1 tablet by mouth 2 (two) times daily. 28 tablet 1  . methocarbamol (ROBAXIN) 500 MG tablet Take 1 tablet (500 mg total) by mouth 2 (two) times daily. (Patient not taking: Reported on 12/18/2016) 20 tablet 0  . naproxen (NAPROSYN) 500 MG tablet Take 1 tablet (500 mg total) by mouth 2 (two) times daily. (Patient not taking: Reported on  12/18/2016) 30 tablet 0  . oxyCODONE-acetaminophen (PERCOCET) 10-325 MG tablet Take 1 tablet by mouth every 4 (four) hours as needed for pain. 30 tablet 0  . oxyCODONE-acetaminophen (PERCOCET/ROXICET) 5-325 MG tablet Take 1 tablet by mouth every 4 (four) hours as needed for severe pain. (Patient not taking: Reported on 12/18/2016) 20 tablet 0  . phenazopyridine (PYRIDIUM) 100 MG tablet Take 1 tablet (100 mg total) by mouth 3 (three) times daily as needed for pain. (Patient not taking: Reported on 12/18/2016) 20 tablet 0  . terconazole (TERAZOL 7) 0.4 % vaginal cream Place 1 applicator vaginally at bedtime. (Patient not taking: Reported on 12/18/2016) 45 g 0  . traMADol (ULTRAM) 50 MG tablet Take 1 tablet (50 mg total) by mouth every 6 (six) hours as needed. (Patient not taking: Reported on 12/18/2016) 60 tablet 0   Current Facility-Administered Medications  Medication Dose Route Frequency Provider Last Rate Last Dose  . levonorgestrel (MIRENA) 20 MCG/24HR IUD   Intrauterine Once Rachelle A Denney, CNM        Review of Systems Review of Systems Constitutional: negative for fatigue and weight loss Respiratory: negative for cough and wheezing Cardiovascular: negative for chest pain, fatigue and palpitations Gastrointestinal: negative for abdominal pain and change in bowel habits Genitourinary:negative Integument/breast: positive for painful mass right breast Musculoskeletal:negative for myalgias Neurological: negative for gait problems  and tremors Behavioral/Psych: negative for abusive relationship, depression Endocrine: negative for temperature intolerance      Blood pressure 123/82, pulse 67, temperature 98.2 F (36.8 C), height 5\' 7"  (1.702 m), weight 164 lb (74.4 kg).  Physical Exam Physical Exam:           General:  Alert and no distress        Breast:  Palpable tender mass right breast                                                                                                                                                                                                         50% of 15 min visit spent on counseling and coordination of care.    Data Reviewed Breast ultrasound:  US BREAST LTD UNI RIGHT INC AXILLA (Accession [id number]) (Order [id number])  Imaging  Date: 12/23/2016 Department: [facility] Released By: Hezzie Bump, RT Authorizing: Brock Bad, MD  Exam Information   Status Exam Begun  Exam Ended   Final [99] 12/23/2016 2:21 PM 12/23/2016 2:47 PM  Study Result   CLINICAL DATA:  Tender mass felt by the patient in the retroareolar right breast for the past 1-2 weeks.  EXAM: 2D DIGITAL DIAGNOSTIC BILATERAL MAMMOGRAM WITH CAD AND ADJUNCT TOMO  ULTRASOUND RIGHT BREAST  COMPARISON:  None.  ACR Breast Density Category b: There are scattered areas of fibroglandular density.  FINDINGS: There is a rounded, circumscribed mass in the retroareolar right breast centered slightly superiorly and laterally. This corresponds to the mass felt by the patient, marked with a metallic marker.  There is a smaller oval, circumscribed mass in the lower inner quadrant of the right breast in the middle third.  There are no findings on the left suspicious for malignancy.  Mammographic images were processed with CAD.  On physical exam, the patient has an approximately 3 cm rounded, palpable mass in the retroareolar right breast, centered laterally. There is no palpable mass in lower inner quadrant of the right breast.  Targeted ultrasound is performed, showing a 2.9 cm cyst containing floating internal debris in the 11 o'clock position of the right breast, 1 cm from the nipple. This corresponds to the palpable mass.  There is also a 1.7 cm cyst in the 9 o'clock retroareolar region.  A 7 mm cyst is demonstrated in the 11 o'clock position of the right breast, 2 cm from the nipple.  A 7 mm cyst is demonstrated  in the 3:30 o'clock position of the right breast, 4 cm from the nipple,  corresponding to the second mammographic mass.  IMPRESSION: Right breast cysts.  No evidence of malignancy in either breast.  RECOMMENDATION: Bilateral screening mammogram in 1 year.  I have discussed the findings and recommendations with the patient. Results were also provided in writing at the conclusion of the visit. If applicable, a reminder letter will be sent to the patient regarding the next appointment.  BI-RADS CATEGORY  2: Benign.   Electronically Signed   By: Beckie SaltsSteven  Reid M.D.   On: 12/23/2016 15:48   Vitals   Height Weight BMI (Calculated)  5\' 7"  (1.702 m) 164 lb (74.4 kg) 25.7  External Result Report   External Result Report  Imaging   Imaging Information  Signed by   Signed Date/Time  Phone Pager  Gordan PaymentREID, STEVE 12/23/2016 3:48 PM [phone]  Signed   Electronically signed by Gordan PaymentSteve Reid, MD on 12/23/16 at 1548 EST  Original Order   Ordered On Ordered By   12/23/2016 2:20 PM Hezzie BumpJessica Shropshire, RT         Assessment     Painful breast cysts    Plan     Percocet Rx  CBC with Diff ordered    Augmentin Rx for possible infectious etiology  Referred to General Surgery for evaluation  F/U prn Orders Placed This Encounter  Procedures  . CBC with Differential/Platelet  . Ambulatory referral to General Surgery    Referral Priority:   Routine    Referral Type:   Surgical    Referral Reason:   Specialty Services Required    Requested Specialty:   General Surgery    Number of Visits Requested:   1   Meds ordered this encounter  Medications  . oxyCODONE-acetaminophen (PERCOCET) 10-325 MG tablet    Sig: Take 1 tablet by mouth every 4 (four) hours as needed for pain.    Dispense:  30 tablet    Refill:  0  . amoxicillin-clavulanate (AUGMENTIN) 875-125 MG tablet    Sig: Take 1 tablet by mouth 2 (two) times daily.    Dispense:  28 tablet    Refill:  1

## 2017-01-01 NOTE — Progress Notes (Signed)
Pt presents f/u breast cyst. Still painful even after taking 3-4 800 mg IB daily. C/o fever yesterday 101 and today 98.2.

## 2017-01-02 LAB — CBC WITH DIFFERENTIAL/PLATELET
Basophils Absolute: 0.1 10*3/uL (ref 0.0–0.2)
Basos: 1 %
EOS (ABSOLUTE): 0.2 10*3/uL (ref 0.0–0.4)
Eos: 3 %
Hematocrit: 34.8 % (ref 34.0–46.6)
Hemoglobin: 11.7 g/dL (ref 11.1–15.9)
Immature Grans (Abs): 0 10*3/uL (ref 0.0–0.1)
Immature Granulocytes: 0 %
Lymphocytes Absolute: 2.6 10*3/uL (ref 0.7–3.1)
Lymphs: 44 %
MCH: 29 pg (ref 26.6–33.0)
MCHC: 33.6 g/dL (ref 31.5–35.7)
MCV: 86 fL (ref 79–97)
Monocytes Absolute: 0.7 10*3/uL (ref 0.1–0.9)
Monocytes: 12 %
Neutrophils Absolute: 2.4 10*3/uL (ref 1.4–7.0)
Neutrophils: 40 %
Platelets: 258 10*3/uL (ref 150–379)
RBC: 4.04 x10E6/uL (ref 3.77–5.28)
RDW: 14 % (ref 12.3–15.4)
WBC: 6 10*3/uL (ref 3.4–10.8)

## 2017-01-06 ENCOUNTER — Telehealth: Payer: Self-pay | Admitting: *Deleted

## 2017-01-06 NOTE — Telephone Encounter (Signed)
Pt called to office stating she thinks she is allergic to pain medication that she was given.  Pt states at last visit she was given an antibiotic and pain medication.  In Med list, pt has Augmentin and Percocet, given on 01/01/17. Pt states that she has only taken pain med when needed. Pt then noticed that she was having itching on her hands, feet, neck and back. Pt was advised to take Benadryl and/or use Benadryl cream in order to help with reaction. Pt made aware that message would be sent to provider in order to possibly change pain Rx.    Please advise on changing pt Rx for pain med due to reaction.

## 2017-04-01 ENCOUNTER — Telehealth: Payer: Self-pay

## 2017-04-01 NOTE — Telephone Encounter (Signed)
Pt requests rf for Xanax. Informed pt I will consult with provider and c/b.

## 2017-04-02 ENCOUNTER — Other Ambulatory Visit: Payer: Self-pay | Admitting: Certified Nurse Midwife

## 2017-04-02 DIAGNOSIS — F4310 Post-traumatic stress disorder, unspecified: Secondary | ICD-10-CM

## 2017-04-02 DIAGNOSIS — F419 Anxiety disorder, unspecified: Principal | ICD-10-CM

## 2017-04-02 DIAGNOSIS — F329 Major depressive disorder, single episode, unspecified: Secondary | ICD-10-CM

## 2017-04-02 DIAGNOSIS — F32A Depression, unspecified: Secondary | ICD-10-CM

## 2017-04-02 MED ORDER — BUPROPION HCL ER (SR) 150 MG PO TB12: 150.0000 mg | ORAL_TABLET | Freq: Two times a day (BID) | ORAL | 5 refills | Status: DC

## 2017-04-02 MED ORDER — ALPRAZOLAM 2 MG PO TABS: 2.0000 mg | ORAL_TABLET | Freq: Every evening | ORAL | 0 refills | Status: AC | PRN

## 2017-04-02 NOTE — Telephone Encounter (Signed)
TC to pt. Xanax and Wellbutrin approved. Behavioral health referral sent. LVM for pt to c/b.

## 2017-04-02 NOTE — Telephone Encounter (Signed)
Info given to pt. Pt will pick up rx on Monday.

## 2017-05-24 ENCOUNTER — Other Ambulatory Visit: Payer: Self-pay | Admitting: Certified Nurse Midwife

## 2017-05-24 ENCOUNTER — Telehealth: Payer: Self-pay | Admitting: *Deleted

## 2017-05-24 DIAGNOSIS — N898 Other specified noninflammatory disorders of vagina: Secondary | ICD-10-CM

## 2017-05-24 MED ORDER — FLUCONAZOLE 200 MG PO TABS
200.0000 mg | ORAL_TABLET | Freq: Once | ORAL | 0 refills | Status: AC
Start: 2017-05-24 — End: 2017-05-24

## 2017-05-24 MED ORDER — TERCONAZOLE 0.8 % VA CREA: 1.0000 | TOPICAL_CREAM | Freq: Every day | VAGINAL | 0 refills | Status: DC

## 2017-05-24 NOTE — Telephone Encounter (Signed)
Please let her know that I have sent in Diflucan and Terconazole vaginal cream was sent to the pharmacy for her to use.  Thank you.

## 2017-05-24 NOTE — Telephone Encounter (Signed)
Patient states she has a yeast infection. She is requesting oral and cream treatment. Rachelle gave her both the last time she was treated.

## 2017-07-09 ENCOUNTER — Other Ambulatory Visit: Payer: Self-pay | Admitting: Certified Nurse Midwife

## 2017-07-09 DIAGNOSIS — F329 Major depressive disorder, single episode, unspecified: Secondary | ICD-10-CM

## 2017-07-09 DIAGNOSIS — F32A Depression, unspecified: Secondary | ICD-10-CM

## 2017-07-09 DIAGNOSIS — F419 Anxiety disorder, unspecified: Principal | ICD-10-CM

## 2017-07-14 ENCOUNTER — Telehealth: Payer: Self-pay

## 2017-07-14 NOTE — Telephone Encounter (Signed)
Pt is having a thick, white vaginal discharge, and vaginal itching. Sx started about 3-4 days ago. Pt would like diflucan and the terconazole cream sent to the pharmacy. I informed pt that I would need to get both prescriptions approved by her provider.

## 2017-07-15 ENCOUNTER — Other Ambulatory Visit: Payer: Self-pay | Admitting: Certified Nurse Midwife

## 2017-07-15 DIAGNOSIS — N898 Other specified noninflammatory disorders of vagina: Secondary | ICD-10-CM

## 2017-07-15 MED ORDER — FLUCONAZOLE 200 MG PO TABS: 200.0000 mg | ORAL_TABLET | Freq: Once | ORAL | 0 refills | Status: AC

## 2017-07-15 MED ORDER — TERCONAZOLE 0.8 % VA CREA: 1.0000 | TOPICAL_CREAM | Freq: Every day | VAGINAL | 0 refills | Status: DC

## 2017-07-15 NOTE — Telephone Encounter (Signed)
Please let her know that both have been sent to the pharmacy.  Thank you.  Lehman Whiteley

## 2017-07-15 NOTE — Telephone Encounter (Signed)
Pt informed

## 2017-08-11 ENCOUNTER — Ambulatory Visit: Payer: Medicaid Other | Admitting: Obstetrics

## 2017-08-19 ENCOUNTER — Ambulatory Visit: Payer: Medicaid Other | Admitting: Certified Nurse Midwife

## 2017-08-20 ENCOUNTER — Ambulatory Visit: Payer: Medicaid Other | Admitting: Certified Nurse Midwife

## 2017-08-25 ENCOUNTER — Ambulatory Visit: Payer: Medicaid Other | Admitting: Certified Nurse Midwife

## 2017-09-15 DIAGNOSIS — Z0000 Encounter for general adult medical examination without abnormal findings: Secondary | ICD-10-CM | POA: Diagnosis not present

## 2017-10-08 DIAGNOSIS — F439 Reaction to severe stress, unspecified: Secondary | ICD-10-CM | POA: Diagnosis not present

## 2017-10-15 DIAGNOSIS — K047 Periapical abscess without sinus: Secondary | ICD-10-CM | POA: Diagnosis not present

## 2017-12-08 DIAGNOSIS — B349 Viral infection, unspecified: Secondary | ICD-10-CM | POA: Diagnosis not present

## 2017-12-08 DIAGNOSIS — J029 Acute pharyngitis, unspecified: Secondary | ICD-10-CM | POA: Diagnosis not present

## 2017-12-08 DIAGNOSIS — R509 Fever, unspecified: Secondary | ICD-10-CM | POA: Diagnosis not present

## 2017-12-16 ENCOUNTER — Telehealth: Payer: Self-pay

## 2017-12-16 NOTE — Telephone Encounter (Signed)
Returned call and pt stated that she has had a Mirena since 2017 and today she started having heavy bleeding and cramping, advised pt that breakthrough bleeding can occur, but that she should schedule appt to have iud placement checked also.

## 2017-12-23 ENCOUNTER — Ambulatory Visit: Payer: Medicaid Other | Admitting: Certified Nurse Midwife

## 2017-12-28 ENCOUNTER — Telehealth: Payer: Self-pay | Admitting: *Deleted

## 2017-12-28 NOTE — Telephone Encounter (Signed)
Lft msg for Patient to callback, patient will need an annual/pap before this visit will be covered by insurance.Marland Kitchen..Marland Kitchen

## 2017-12-30 ENCOUNTER — Ambulatory Visit: Payer: Medicaid Other | Admitting: Certified Nurse Midwife

## 2018-03-21 ENCOUNTER — Ambulatory Visit: Payer: Medicaid Other | Admitting: Certified Nurse Midwife

## 2018-03-31 DIAGNOSIS — Z0000 Encounter for general adult medical examination without abnormal findings: Secondary | ICD-10-CM | POA: Diagnosis not present

## 2018-03-31 DIAGNOSIS — F324 Major depressive disorder, single episode, in partial remission: Secondary | ICD-10-CM | POA: Diagnosis not present

## 2018-03-31 DIAGNOSIS — I83813 Varicose veins of bilateral lower extremities with pain: Secondary | ICD-10-CM | POA: Diagnosis not present

## 2018-04-06 ENCOUNTER — Other Ambulatory Visit: Payer: Self-pay | Admitting: Certified Nurse Midwife

## 2018-04-06 DIAGNOSIS — F329 Major depressive disorder, single episode, unspecified: Secondary | ICD-10-CM

## 2018-04-06 DIAGNOSIS — F32A Depression, unspecified: Secondary | ICD-10-CM

## 2018-04-06 DIAGNOSIS — F419 Anxiety disorder, unspecified: Principal | ICD-10-CM

## 2018-04-06 DIAGNOSIS — F4310 Post-traumatic stress disorder, unspecified: Secondary | ICD-10-CM

## 2018-04-06 NOTE — Telephone Encounter (Signed)
Refill request from pharmacy. Please send if approved.     Name from pharmacy: BUPROPION HCL SR 150 MG TABLET       Will file in chart as: buPROPion (WELLBUTRIN SR) 150 MG 12 hr tablet   Sig: TAKE 1 TABLET BY MOUTH TWICE A DAY   Disp:  30 tablet (Pharmacy requested: 30 each)  Refills:  0   Start: 04/06/2018   Class: Normal   For: Anxiety and depression, PTSD (post-traumatic stress disorder)   Requested on: 04/02/2017   Last ordered: 1 year ago by Roe Coombsachelle A Denney, CNM Last refill: 04/05/2017   Rx #: [id number]     [place] be filled at: [id number] [place]  - [address]

## 2018-04-20 DIAGNOSIS — I8311 Varicose veins of right lower extremity with inflammation: Secondary | ICD-10-CM | POA: Diagnosis not present

## 2018-04-20 DIAGNOSIS — I8312 Varicose veins of left lower extremity with inflammation: Secondary | ICD-10-CM | POA: Diagnosis not present

## 2018-06-01 DIAGNOSIS — M25561 Pain in right knee: Secondary | ICD-10-CM | POA: Diagnosis not present

## 2018-06-01 DIAGNOSIS — M25562 Pain in left knee: Secondary | ICD-10-CM | POA: Diagnosis not present

## 2018-06-06 ENCOUNTER — Ambulatory Visit (INDEPENDENT_AMBULATORY_CARE_PROVIDER_SITE_OTHER): Payer: Medicaid Other | Admitting: Certified Nurse Midwife

## 2018-06-06 ENCOUNTER — Other Ambulatory Visit (HOSPITAL_COMMUNITY)
Admission: RE | Admit: 2018-06-06 | Discharge: 2018-06-06 | Disposition: A | Discharge status: Home / Self Care | Payer: BLUE CROSS/BLUE SHIELD | Source: Ambulatory Visit | Attending: Certified Nurse Midwife | Admitting: Certified Nurse Midwife

## 2018-06-06 ENCOUNTER — Encounter: Payer: Self-pay | Admitting: Certified Nurse Midwife

## 2018-06-06 VITALS — BP 106/73 | HR 80 | Wt 168.8 lb

## 2018-06-06 DIAGNOSIS — Z0000 Encounter for general adult medical examination without abnormal findings: Secondary | ICD-10-CM

## 2018-06-06 DIAGNOSIS — N760 Acute vaginitis: Secondary | ICD-10-CM

## 2018-06-06 DIAGNOSIS — Z01419 Encounter for gynecological examination (general) (routine) without abnormal findings: Secondary | ICD-10-CM | POA: Insufficient documentation

## 2018-06-06 DIAGNOSIS — B373 Candidiasis of vulva and vagina: Secondary | ICD-10-CM

## 2018-06-06 DIAGNOSIS — B3731 Acute candidiasis of vulva and vagina: Secondary | ICD-10-CM

## 2018-06-06 DIAGNOSIS — N390 Urinary tract infection, site not specified: Secondary | ICD-10-CM

## 2018-06-06 DIAGNOSIS — R319 Hematuria, unspecified: Secondary | ICD-10-CM

## 2018-06-06 DIAGNOSIS — B9689 Other specified bacterial agents as the cause of diseases classified elsewhere: Secondary | ICD-10-CM

## 2018-06-06 LAB — POCT URINALYSIS DIPSTICK
Bilirubin, UA: NEGATIVE
Blood, UA: POSITIVE
Glucose, UA: NEGATIVE
Nitrite, UA: POSITIVE
Protein, UA: POSITIVE — AB
Spec Grav, UA: 1.02 (ref 1.010–1.025)
Urobilinogen, UA: 0.2 E.U./dL
pH, UA: 7.5 (ref 5.0–8.0)

## 2018-06-06 MED ORDER — FLUCONAZOLE 200 MG PO TABS: 200.0000 mg | ORAL_TABLET | Freq: Once | ORAL | 0 refills | Status: AC

## 2018-06-06 MED ORDER — PHENAZOPYRIDINE HCL 200 MG PO TABS: 200.0000 mg | ORAL_TABLET | Freq: Three times a day (TID) | ORAL | 0 refills | Status: AC | PRN

## 2018-06-06 MED ORDER — TERCONAZOLE 0.8 % VA CREA: 1.0000 | TOPICAL_CREAM | Freq: Every day | VAGINAL | 0 refills | Status: DC

## 2018-06-06 MED ORDER — METRONIDAZOLE 500 MG PO TABS: 500.0000 mg | ORAL_TABLET | Freq: Two times a day (BID) | ORAL | 0 refills | Status: AC

## 2018-06-06 MED ORDER — SULFAMETHOXAZOLE-TRIMETHOPRIM 400-80 MG PO TABS: 1.0000 | ORAL_TABLET | Freq: Two times a day (BID) | ORAL | 0 refills | Status: AC

## 2018-06-06 NOTE — Progress Notes (Signed)
Pt is here for annual physical. Last mmg 1/18, due for bilat. Screening mammo- advised pt to call and schedule this. Mirena IUD for contraception. Pap due today, last pap 08/04/16 normal -hpv. Pt states she is having painful urination for a couple of days.

## 2018-06-06 NOTE — Progress Notes (Signed)
Subjective:        Stephanie Moore is a 42 y.o. female here for a routine exam.  Current complaints: vaginal discharge and UTI symptoms.  Is currently sexually active with the same female partner for 1.5 years.  Her boys are doing well.  She has 1.5 years of school left at [employer].  Does not have periods with the IUD.  [facility] coupon/information given.  Working part-time.    Declines blood STD screening.  IUD was placed 09/17/16. Is happy with her IUD.   Personal health questionnaire:  Is patient Ashkenazi Jewish, have a family history of breast and/or ovarian cancer: no Is there a family history of uterine cancer diagnosed at age < 1650, gastrointestinal cancer, urinary tract cancer, family member who is a Personnel officerLynch syndrome-associated carrier: no Is the patient overweight and hypertensive, family history of diabetes, personal history of gestational diabetes, preeclampsia or PCOS: no Is patient over 3355, have PCOS,  family history of premature CHD under age 43, diabetes, smoke, have hypertension or peripheral artery disease:  no At any time, has a partner hit, kicked or otherwise hurt or frightened you?: no Over the past 2 weeks, have you felt down, depressed or hopeless?: no Over the past 2 weeks, have you felt little interest or pleasure in doing things?:not asked   Gynecologic History No LMP recorded (lmp unknown). (Menstrual status: IUD). Contraception: IUD Last Pap: 08/04/16. Results were: normal Last mammogram: 12/23/16. Results were: normal  Obstetric History OB History  Gravida Para Term Preterm AB Living  3 3 3     3   SAB TAB Ectopic Multiple Live Births          3    # Outcome Date GA Lbr Len/2nd Weight Sex Delivery Anes PTL Lv  3 Term     M    LIV  2 Term     M    LIV  1 Term     M    LIV    Past Medical History:  Diagnosis Date  . Allergy   . Asthma     Past Surgical History:  Procedure Laterality Date  . WISDOM TOOTH EXTRACTION       Current Outpatient  Medications:  .  alprazolam (XANAX) 2 MG tablet, Take 1 tablet (2 mg total) by mouth at bedtime as needed for sleep., Disp: 30 tablet, Rfl: 0 .  buPROPion (WELLBUTRIN SR) 150 MG 12 hr tablet, TAKE 1 TABLET BY MOUTH TWICE A DAY, Disp: 30 tablet, Rfl: 0 .  ibuprofen (ADVIL,MOTRIN) 800 MG tablet, Take 1 tablet (800 mg total) by mouth every 8 (eight) hours as needed., Disp: 90 tablet, Rfl: 2 .  fluconazole (DIFLUCAN) 200 MG tablet, Take 1 tablet (200 mg total) by mouth once for 1 dose. Repeat dose in 48-72 hours., Disp: 3 tablet, Rfl: 0 .  metroNIDAZOLE (FLAGYL) 500 MG tablet, Take 1 tablet (500 mg total) by mouth 2 (two) times daily., Disp: 14 tablet, Rfl: 0 .  phenazopyridine (PYRIDIUM) 200 MG tablet, Take 1 tablet (200 mg total) by mouth 3 (three) times daily as needed for pain., Disp: 30 tablet, Rfl: 0 .  sulfamethoxazole-trimethoprim (BACTRIM) 400-80 MG tablet, Take 1 tablet by mouth 2 (two) times daily., Disp: 14 tablet, Rfl: 0 .  terconazole (TERAZOL 3) 0.8 % vaginal cream, Place 1 applicator vaginally at bedtime., Disp: 20 g, Rfl: 0  Current Facility-Administered Medications:  .  levonorgestrel (MIRENA) 20 MCG/24HR IUD, , Intrauterine, Once, Carroll Ranney A, CNM  No Known Allergies  Social History   Tobacco Use  . Smoking status: Never Smoker  . Smokeless tobacco: Never Used  . Tobacco comment: divorced, final 2011. Lives with her 3 sons 16-8-4. 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 Pt born [place] in [place] since 2010  Substance Use Topics  . Alcohol use: Yes    Alcohol/week: 0.0 oz    Comment: Social    Family History  Problem Relation Age of Onset  . Diabetes Neg Hx   . Hypertension Neg Hx   . Hyperlipidemia Neg Hx   . Cancer Neg Hx       Review of Systems  Constitutional: negative for fatigue and weight loss Respiratory: negative for cough and wheezing Cardiovascular: negative for chest pain, fatigue and  palpitations Gastrointestinal: negative for abdominal pain and change in bowel habits Musculoskeletal:negative for myalgias Neurological: negative for gait problems and tremors Behavioral/Psych: negative for abusive relationship, depression Endocrine: negative for temperature intolerance    Genitourinary:negative for abnormal menstrual periods, genital lesions, hot flashes, sexual problems and vaginal discharge Integument/breast: negative for breast lump, breast tenderness, nipple discharge and skin lesion(s)    Objective:       BP 106/73   Pulse 80   Wt 168 lb 12.8 oz (76.6 kg)   LMP  (LMP Unknown)   BMI 26.44 kg/m  General:   alert  Skin:   no rash or abnormalities  Lungs:   clear to auscultation bilaterally  Heart:   regular rate and rhythm, S1, S2 normal, no murmur, click, rub or gallop  Breasts:   normal without suspicious masses, skin or nipple changes or axillary nodes  Abdomen:  normal findings: no organomegaly, soft, non-tender and no hernia  Pelvis:  External genitalia: normal general appearance Urinary system: urethral meatus normal and bladder without fullness, nontender Vaginal: normal without tenderness, induration or masses Cervix: normal appearance, IUD strings present Adnexa: normal bimanual exam Uterus: anteverted and non-tender, normal size   Lab Review Urine pregnancy test Labs reviewed yes Radiologic studies reviewed yes  50% of 30 min visit spent on counseling and coordination of care.   Assessment & Plan    Healthy female exam.    1. Encounter for well woman exam with routine gynecological exam    - Cytology - PAP  2. Hematuria, unspecified type     - POCT Urinalysis Dipstick - Urine Culture  3. Yeast infection of the vagina   - fluconazole (DIFLUCAN) 200 MG tablet; Take 1 tablet (200 mg total) by mouth once for 1 dose. Repeat dose in 48-72 hours.  Dispense: 3 tablet; Refill: 0 - terconazole (TERAZOL 3) 0.8 % vaginal cream; Place 1  applicator vaginally at bedtime.  Dispense: 20 g; Refill: 0  4. Acute lower UTI    - sulfamethoxazole-trimethoprim (BACTRIM) 400-80 MG tablet; Take 1 tablet by mouth 2 (two) times daily.  Dispense: 14 tablet; Refill: 0 - phenazopyridine (PYRIDIUM) 200 MG tablet; Take 1 tablet (200 mg total) by mouth 3 (three) times daily as needed for pain.  Dispense: 30 tablet; Refill: 0  5. BV (bacterial vaginosis)    - metroNIDAZOLE (FLAGYL) 500 MG tablet; Take 1 tablet (500 mg total) by mouth 2 (two) times daily.  Dispense: 14 tablet; Refill: 0 - Cervicovaginal ancillary only   Education reviewed: calcium supplements, depression evaluation, low fat, low cholesterol diet, safe sex/STD prevention, self breast exams, skin cancer screening and weight bearing exercise. Contraception: IUD. Mammogram ordered. Follow up in: 1 year.  Meds ordered this encounter  Medications  . metroNIDAZOLE (FLAGYL) 500 MG tablet    Sig: Take 1 tablet (500 mg total) by mouth 2 (two) times daily.    Dispense:  14 tablet    Refill:  0  . sulfamethoxazole-trimethoprim (BACTRIM) 400-80 MG tablet    Sig: Take 1 tablet by mouth 2 (two) times daily.    Dispense:  14 tablet    Refill:  0  . fluconazole (DIFLUCAN) 200 MG tablet    Sig: Take 1 tablet (200 mg total) by mouth once for 1 dose. Repeat dose in 48-72 hours.    Dispense:  3 tablet    Refill:  0  . terconazole (TERAZOL 3) 0.8 % vaginal cream    Sig: Place 1 applicator vaginally at bedtime.    Dispense:  20 g    Refill:  0  . phenazopyridine (PYRIDIUM) 200 MG tablet    Sig: Take 1 tablet (200 mg total) by mouth 3 (three) times daily as needed for pain.    Dispense:  30 tablet    Refill:  0   Orders Placed This Encounter  Procedures  . Urine Culture  . POCT Urinalysis Dipstick    Patient to go to [facility] for annual mammogram.  Follow up as needed.

## 2018-06-07 LAB — CYTOLOGY - PAP
Adequacy: ABSENT
Diagnosis: NEGATIVE
HPV: NOT DETECTED

## 2018-06-07 LAB — CERVICOVAGINAL ANCILLARY ONLY
Chlamydia: NEGATIVE
Neisseria Gonorrhea: NEGATIVE

## 2018-06-10 LAB — URINE CULTURE

## 2018-06-21 ENCOUNTER — Other Ambulatory Visit: Payer: Self-pay | Admitting: Certified Nurse Midwife

## 2018-07-01 DIAGNOSIS — M23321 Other meniscus derangements, posterior horn of medial meniscus, right knee: Secondary | ICD-10-CM | POA: Diagnosis not present

## 2018-07-01 DIAGNOSIS — M94261 Chondromalacia, right knee: Secondary | ICD-10-CM | POA: Diagnosis not present

## 2018-07-08 DIAGNOSIS — M25461 Effusion, right knee: Secondary | ICD-10-CM | POA: Diagnosis not present

## 2018-07-08 DIAGNOSIS — M94261 Chondromalacia, right knee: Secondary | ICD-10-CM | POA: Diagnosis not present

## 2018-07-08 DIAGNOSIS — G8918 Other acute postprocedural pain: Secondary | ICD-10-CM | POA: Diagnosis not present

## 2018-07-08 DIAGNOSIS — S83241A Other tear of medial meniscus, current injury, right knee, initial encounter: Secondary | ICD-10-CM | POA: Diagnosis not present

## 2018-07-14 HISTORY — PX: KNEE SURGERY: SHX244

## 2018-07-19 DIAGNOSIS — Z9889 Other specified postprocedural states: Secondary | ICD-10-CM | POA: Diagnosis not present

## 2018-07-26 DIAGNOSIS — M23321 Other meniscus derangements, posterior horn of medial meniscus, right knee: Secondary | ICD-10-CM | POA: Diagnosis not present

## 2018-07-26 DIAGNOSIS — M25661 Stiffness of right knee, not elsewhere classified: Secondary | ICD-10-CM | POA: Diagnosis not present

## 2018-07-26 DIAGNOSIS — M25561 Pain in right knee: Secondary | ICD-10-CM | POA: Diagnosis not present

## 2018-07-28 DIAGNOSIS — M23321 Other meniscus derangements, posterior horn of medial meniscus, right knee: Secondary | ICD-10-CM | POA: Diagnosis not present

## 2018-07-28 DIAGNOSIS — M25561 Pain in right knee: Secondary | ICD-10-CM | POA: Diagnosis not present

## 2018-07-28 DIAGNOSIS — M25661 Stiffness of right knee, not elsewhere classified: Secondary | ICD-10-CM | POA: Diagnosis not present

## 2018-08-02 DIAGNOSIS — M23321 Other meniscus derangements, posterior horn of medial meniscus, right knee: Secondary | ICD-10-CM | POA: Diagnosis not present

## 2018-08-02 DIAGNOSIS — M25461 Effusion, right knee: Secondary | ICD-10-CM | POA: Diagnosis not present

## 2018-08-23 DIAGNOSIS — M25661 Stiffness of right knee, not elsewhere classified: Secondary | ICD-10-CM | POA: Diagnosis not present

## 2018-08-23 DIAGNOSIS — M23321 Other meniscus derangements, posterior horn of medial meniscus, right knee: Secondary | ICD-10-CM | POA: Diagnosis not present

## 2018-08-23 DIAGNOSIS — M25561 Pain in right knee: Secondary | ICD-10-CM | POA: Diagnosis not present

## 2018-09-06 DIAGNOSIS — M25561 Pain in right knee: Secondary | ICD-10-CM | POA: Diagnosis not present

## 2018-09-06 DIAGNOSIS — M25661 Stiffness of right knee, not elsewhere classified: Secondary | ICD-10-CM | POA: Diagnosis not present

## 2018-09-06 DIAGNOSIS — M23321 Other meniscus derangements, posterior horn of medial meniscus, right knee: Secondary | ICD-10-CM | POA: Diagnosis not present

## 2018-09-08 DIAGNOSIS — M25661 Stiffness of right knee, not elsewhere classified: Secondary | ICD-10-CM | POA: Diagnosis not present

## 2018-09-08 DIAGNOSIS — M23321 Other meniscus derangements, posterior horn of medial meniscus, right knee: Secondary | ICD-10-CM | POA: Diagnosis not present

## 2018-09-08 DIAGNOSIS — M94261 Chondromalacia, right knee: Secondary | ICD-10-CM | POA: Diagnosis not present

## 2018-09-08 DIAGNOSIS — M25561 Pain in right knee: Secondary | ICD-10-CM | POA: Diagnosis not present

## 2018-09-26 DIAGNOSIS — B379 Candidiasis, unspecified: Secondary | ICD-10-CM | POA: Diagnosis not present

## 2018-09-26 DIAGNOSIS — L219 Seborrheic dermatitis, unspecified: Secondary | ICD-10-CM | POA: Diagnosis not present

## 2018-10-12 DIAGNOSIS — M79604 Pain in right leg: Secondary | ICD-10-CM | POA: Diagnosis not present

## 2018-10-17 ENCOUNTER — Other Ambulatory Visit: Payer: Self-pay

## 2018-10-17 DIAGNOSIS — B3731 Acute candidiasis of vulva and vagina: Secondary | ICD-10-CM

## 2018-10-17 DIAGNOSIS — B373 Candidiasis of vulva and vagina: Secondary | ICD-10-CM

## 2018-10-17 MED ORDER — TERCONAZOLE 0.8 % VA CREA: 1.0000 | TOPICAL_CREAM | Freq: Every day | VAGINAL | 0 refills | Status: AC

## 2018-10-17 NOTE — Progress Notes (Signed)
Rx sent per protocol for yeast  Pt advised is no relief to make appt.  Pt voiced understanding and confirm pharmacy.

## 2018-10-18 DIAGNOSIS — M545 Low back pain: Secondary | ICD-10-CM | POA: Diagnosis not present

## 2018-10-18 DIAGNOSIS — M79604 Pain in right leg: Secondary | ICD-10-CM | POA: Diagnosis not present

## 2018-12-05 ENCOUNTER — Other Ambulatory Visit: Payer: Self-pay

## 2018-12-05 ENCOUNTER — Emergency Department (HOSPITAL_COMMUNITY): Payer: BLUE CROSS/BLUE SHIELD

## 2018-12-05 ENCOUNTER — Encounter (HOSPITAL_COMMUNITY): Payer: Self-pay | Admitting: Emergency Medicine

## 2018-12-05 ENCOUNTER — Emergency Department (HOSPITAL_COMMUNITY)
Admission: EM | Admit: 2018-12-05 | Discharge: 2018-12-05 | Disposition: A | Discharge status: Home / Self Care | Payer: BLUE CROSS/BLUE SHIELD | Attending: Emergency Medicine | Admitting: Emergency Medicine

## 2018-12-05 DIAGNOSIS — J45909 Unspecified asthma, uncomplicated: Secondary | ICD-10-CM | POA: Diagnosis not present

## 2018-12-05 DIAGNOSIS — M25552 Pain in left hip: Secondary | ICD-10-CM | POA: Insufficient documentation

## 2018-12-05 DIAGNOSIS — M25561 Pain in right knee: Secondary | ICD-10-CM | POA: Insufficient documentation

## 2018-12-05 DIAGNOSIS — Z79899 Other long term (current) drug therapy: Secondary | ICD-10-CM | POA: Diagnosis not present

## 2018-12-05 DIAGNOSIS — R600 Localized edema: Secondary | ICD-10-CM | POA: Insufficient documentation

## 2018-12-05 DIAGNOSIS — R52 Pain, unspecified: Secondary | ICD-10-CM

## 2018-12-05 LAB — I-STAT BETA HCG BLOOD, ED (MC, WL, AP ONLY): I-stat hCG, quantitative: <5 m[IU]/mL (ref <5)

## 2018-12-05 MED ORDER — NAPROXEN 500 MG PO TABS
500.0000 mg | ORAL_TABLET | Freq: Once | ORAL | Status: AC
  Administered 2018-12-05: 500 mg via ORAL
  Filled 2018-12-05: qty 1

## 2018-12-05 MED ORDER — NAPROXEN 375 MG PO TABS: ORAL_TABLET | ORAL | 0 refills | Status: AC

## 2018-12-05 NOTE — ED Provider Notes (Signed)
[facility]-EMERGENCY DEPT Provider Note: Lowella DellJ. Lane Jacobie Stamey, MD, FACEP  CSN: [id number] MRN: [id number] ARRIVAL: 12/05/18 at 0027 ROOM: [facility]15/[facility]15   CHIEF COMPLAINT  Hip Pain and Knee Pain   HISTORY OF PRESENT ILLNESS  12/05/18 1:01 AM Stephanie Moore is a 42 y.o. female who had knee surgery on the right in August of this year by Dr. Turner Danielsowan.  She is here with acute pain in her right knee and her left hip that began yesterday morning.  She denies any trauma, fall or other inciting cause.  She rates her pain as a 5-6 out of 10.  Pain is worse with weightbearing and ambulation.  There is some mild swelling associated with the right knee.  She took a [place] powder yesterday morning but has not taken anything else.    Past Medical History:  Diagnosis Date  . Allergy   . Asthma     Past Surgical History:  Procedure Laterality Date  . KNEE SURGERY Right 07/2018  . WISDOM TOOTH EXTRACTION      Family History  Problem Relation Age of Onset  . Diabetes Neg Hx   . Hypertension Neg Hx   . Hyperlipidemia Neg Hx   . Cancer Neg Hx     Social History   Tobacco Use  . Smoking status: Never Smoker  . Smokeless tobacco: Never Used  . Tobacco comment: divorced, final 2011. Lives with her 3 sons 16-8-4. 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 [place]  Substance Use Topics  . Alcohol use: Yes    Alcohol/week: 0.0 standard drinks    Comment: Social  . Drug use: No    Prior to Admission medications   Medication Sig Start Date End Date Taking? Authorizing Provider  alprazolam Prudy Feeler(XANAX) 2 MG tablet Take 1 tablet (2 mg total) by mouth at bedtime as needed for sleep. 04/02/17   Roe Coombsenney, Rachelle A, CNM  buPROPion (WELLBUTRIN SR) 150 MG 12 hr tablet TAKE 1 TABLET BY MOUTH TWICE A DAY 04/06/18   Denney, Rachelle A, CNM  ibuprofen (ADVIL,MOTRIN) 800 MG tablet Take 1 tablet (800 mg total) by mouth every 8 (eight) hours as needed. 09/17/16    Orvilla Cornwallenney, Rachelle A, CNM  metroNIDAZOLE (FLAGYL) 500 MG tablet Take 1 tablet (500 mg total) by mouth 2 (two) times daily. 06/06/18   Orvilla Cornwallenney, Rachelle A, CNM  phenazopyridine (PYRIDIUM) 200 MG tablet Take 1 tablet (200 mg total) by mouth 3 (three) times daily as needed for pain. 06/06/18   Orvilla Cornwallenney, Rachelle A, CNM  sulfamethoxazole-trimethoprim (BACTRIM) 400-80 MG tablet Take 1 tablet by mouth 2 (two) times daily. 06/06/18   Orvilla Cornwallenney, Rachelle A, CNM  terconazole (TERAZOL 3) 0.8 % vaginal cream Place 1 applicator vaginally at bedtime. 10/17/18   Brock BadHarper, Charles A, MD    Allergies Patient has no known allergies.   REVIEW OF SYSTEMS  Negative except as noted here or in the History of Present Illness.   PHYSICAL EXAMINATION  Initial Vital Signs Blood pressure 112/82, pulse 75, temperature 98.2 F (36.8 C), temperature source Oral, resp. rate 16, height 5\' 6"  (1.676 m), weight 80.7 kg, SpO2 100 %.  Examination General: Well-developed, well-nourished female in no acute distress; appearance consistent with age of record HENT: normocephalic; atraumatic Eyes: pupils equal, round and reactive to light; extraocular muscles intact Neck: supple Heart: regular rate and rhythm Lungs: clear to auscultation bilaterally Abdomen: soft; nondistended; nontender; bowel sounds present Extremities: No deformity; full range of motion; pulses normal; trace  effusion of right knee; no pain on passive movement of right knee or left hip Neurologic: Awake, alert and oriented; motor function intact in all extremities and symmetric; no facial droop Skin: Warm and dry Psychiatric: Normal mood and affect   RESULTS  Summary of this visit's results, reviewed by myself:   EKG Interpretation  Date/Time:    Ventricular Rate:    PR Interval:    QRS Duration:   QT Interval:    QTC Calculation:   R Axis:     Text Interpretation:        Laboratory Studies: Results for orders placed or performed during the hospital  encounter of 12/05/18 (from the past 24 hour(s))  I-Stat Beta hCG blood, ED ([facility], [facility], [facility] only)     Status: None   Collection Time: 12/05/18  1:38 AM  Result Value Ref Range   I-stat hCG, quantitative <5.0 <5 mIU/mL   Comment 3           Imaging Studies: Dg Knee Complete 4 Views Right  Result Date: 12/05/2018 CLINICAL DATA:  Right knee pain EXAM: RIGHT KNEE - COMPLETE 4+ VIEW COMPARISON:  None. FINDINGS: No evidence of fracture, dislocation, or joint effusion. No evidence of arthropathy or other focal bone abnormality. Soft tissues are unremarkable. IMPRESSION: No acute abnormality noted. Electronically Signed   By: Alcide CleverMark  Lukens M.D.   On: 12/05/2018 02:32   Dg Hip Infant Unilat W Or Wo Pelvis 2-3 Views Left  Result Date: 12/05/2018 CLINICAL DATA:  Left hip pain, no known injury, initial encounter EXAM: DG HIP (WITH OR WITHOUT PELVIS) 3V LEFT COMPARISON:  None. FINDINGS: The pelvic ring is intact. An IUD is noted in place. Mild degenerative changes of the hip joints are seen. No acute fracture or dislocation is noted. No soft tissue changes are seen. IMPRESSION: No acute abnormality noted. Electronically Signed   By: Alcide CleverMark  Lukens M.D.   On: 12/05/2018 02:33    ED COURSE and MDM  Nursing notes and initial vitals signs, including pulse oximetry, reviewed.  Vitals:   12/05/18 0035  BP: 112/82  Pulse: 75  Resp: 16  Temp: 98.2 F (36.8 C)  TempSrc: Oral  SpO2: 100%  Weight: 80.7 kg  Height: 5\' 6"  (1.676 m)   Patient advised of reassuring diagnostic rise.  We will place her on an NSAID and have her follow-up with her orthopedist, Dr. Turner Danielsowan.  Consultation with the [facility] [place] controlled substances database reveals the patient has received 8 prescriptions for hydrocodone, oxycodone and tramadol in the past 2 years.  PROCEDURES    ED DIAGNOSES     ICD-10-CM   1. Acute pain of right knee M25.561            2. Left hip pain M25.552        Gizell Danser, Jonny RuizJohn, MD 12/05/18  [phone]

## 2018-12-05 NOTE — ED Triage Notes (Signed)
Patient complaining of right knee pain and left hip pain. Patient states that when she stands on it, it is excruciating pain. Patient states she had surgery on her right knee in august and it has never really heeled. She feels that it is worse. She states her right knee still swells.

## 2018-12-20 ENCOUNTER — Telehealth: Payer: Self-pay

## 2018-12-20 NOTE — Telephone Encounter (Signed)
Pt left message on vm requesting call back. Attempted to call pt, no answer. Left message on vm for pt to return call to office

## 2018-12-30 ENCOUNTER — Telehealth: Payer: Self-pay

## 2018-12-30 DIAGNOSIS — B379 Candidiasis, unspecified: Secondary | ICD-10-CM

## 2018-12-30 DIAGNOSIS — F439 Reaction to severe stress, unspecified: Secondary | ICD-10-CM | POA: Diagnosis not present

## 2018-12-30 DIAGNOSIS — Z6827 Body mass index (BMI) 27.0-27.9, adult: Secondary | ICD-10-CM | POA: Diagnosis not present

## 2018-12-30 DIAGNOSIS — Z1331 Encounter for screening for depression: Secondary | ICD-10-CM | POA: Diagnosis not present

## 2018-12-30 DIAGNOSIS — H547 Unspecified visual loss: Secondary | ICD-10-CM | POA: Diagnosis not present

## 2018-12-30 DIAGNOSIS — Z566 Other physical and mental strain related to work: Secondary | ICD-10-CM | POA: Diagnosis not present

## 2018-12-30 DIAGNOSIS — Z23 Encounter for immunization: Secondary | ICD-10-CM | POA: Diagnosis not present

## 2018-12-30 DIAGNOSIS — R21 Rash and other nonspecific skin eruption: Secondary | ICD-10-CM | POA: Diagnosis not present

## 2018-12-30 DIAGNOSIS — Z0000 Encounter for general adult medical examination without abnormal findings: Secondary | ICD-10-CM | POA: Diagnosis not present

## 2018-12-30 MED ORDER — FLUCONAZOLE 150 MG PO TABS: 150.0000 mg | ORAL_TABLET | Freq: Once | ORAL | 0 refills | Status: AC

## 2018-12-30 NOTE — Telephone Encounter (Signed)
Pt called requesting rx for yeast. Pt states that she just completed a course of antibiotics. She complains of having vaginal itching and irritation. Per protocol [facility] sent [facility]

## 2019-01-09 DIAGNOSIS — F4323 Adjustment disorder with mixed anxiety and depressed mood: Secondary | ICD-10-CM | POA: Diagnosis not present

## 2019-01-10 ENCOUNTER — Telehealth: Payer: Self-pay

## 2019-01-10 ENCOUNTER — Ambulatory Visit (INDEPENDENT_AMBULATORY_CARE_PROVIDER_SITE_OTHER): Payer: BLUE CROSS/BLUE SHIELD

## 2019-01-10 DIAGNOSIS — R300 Dysuria: Secondary | ICD-10-CM | POA: Diagnosis not present

## 2019-01-10 LAB — POCT URINALYSIS DIPSTICK
Bilirubin, UA: NEGATIVE
Blood, UA: POSITIVE
Glucose, UA: NEGATIVE
Ketones, UA: NEGATIVE
Leukocytes, UA: NEGATIVE
Nitrite, UA: NEGATIVE
Protein, UA: NEGATIVE
Spec Grav, UA: 1.01 (ref 1.010–1.025)
Urobilinogen, UA: 0.2 E.U./dL
pH, UA: 7 (ref 5.0–8.0)

## 2019-01-10 NOTE — Telephone Encounter (Signed)
Returned call and pt stated that she thinks she has a UTI. Pt reports painful urination, advised pt to come in office and leave a urine sample, pt agreed.

## 2019-01-10 NOTE — Progress Notes (Signed)
Pt called with complains dysuria and frequency of urination. Pt instructed to come in and leave a urine sample for testing. Urine dipped and culture sent.

## 2019-01-12 LAB — URINE CULTURE

## 2019-01-16 DIAGNOSIS — F4323 Adjustment disorder with mixed anxiety and depressed mood: Secondary | ICD-10-CM | POA: Diagnosis not present

## 2019-01-23 DIAGNOSIS — F4323 Adjustment disorder with mixed anxiety and depressed mood: Secondary | ICD-10-CM | POA: Diagnosis not present

## 2019-01-26 DIAGNOSIS — S31109A Unspecified open wound of abdominal wall, unspecified quadrant without penetration into peritoneal cavity, initial encounter: Secondary | ICD-10-CM | POA: Diagnosis not present

## 2019-01-26 DIAGNOSIS — Z1331 Encounter for screening for depression: Secondary | ICD-10-CM | POA: Diagnosis not present

## 2019-01-26 DIAGNOSIS — F419 Anxiety disorder, unspecified: Secondary | ICD-10-CM | POA: Diagnosis not present

## 2019-01-26 DIAGNOSIS — R21 Rash and other nonspecific skin eruption: Secondary | ICD-10-CM | POA: Diagnosis not present

## 2019-01-31 DIAGNOSIS — L700 Acne vulgaris: Secondary | ICD-10-CM | POA: Diagnosis not present

## 2019-01-31 DIAGNOSIS — L304 Erythema intertrigo: Secondary | ICD-10-CM | POA: Diagnosis not present

## 2019-01-31 DIAGNOSIS — L219 Seborrheic dermatitis, unspecified: Secondary | ICD-10-CM | POA: Diagnosis not present

## 2019-01-31 DIAGNOSIS — Z23 Encounter for immunization: Secondary | ICD-10-CM | POA: Diagnosis not present

## 2019-01-31 DIAGNOSIS — F4323 Adjustment disorder with mixed anxiety and depressed mood: Secondary | ICD-10-CM | POA: Diagnosis not present

## 2019-02-13 DIAGNOSIS — F4323 Adjustment disorder with mixed anxiety and depressed mood: Secondary | ICD-10-CM | POA: Diagnosis not present

## 2019-02-16 DIAGNOSIS — Z01419 Encounter for gynecological examination (general) (routine) without abnormal findings: Secondary | ICD-10-CM | POA: Diagnosis not present

## 2019-02-16 DIAGNOSIS — Z113 Encounter for screening for infections with a predominantly sexual mode of transmission: Secondary | ICD-10-CM | POA: Diagnosis not present

## 2019-02-16 DIAGNOSIS — N760 Acute vaginitis: Secondary | ICD-10-CM | POA: Diagnosis not present

## 2019-02-16 DIAGNOSIS — Z6828 Body mass index (BMI) 28.0-28.9, adult: Secondary | ICD-10-CM | POA: Diagnosis not present

## 2019-02-20 DIAGNOSIS — F4323 Adjustment disorder with mixed anxiety and depressed mood: Secondary | ICD-10-CM | POA: Diagnosis not present

## 2019-03-09 DIAGNOSIS — F419 Anxiety disorder, unspecified: Secondary | ICD-10-CM | POA: Diagnosis not present

## 2019-03-09 DIAGNOSIS — G479 Sleep disorder, unspecified: Secondary | ICD-10-CM | POA: Diagnosis not present

## 2019-03-09 DIAGNOSIS — F410 Panic disorder [episodic paroxysmal anxiety] without agoraphobia: Secondary | ICD-10-CM | POA: Diagnosis not present

## 2019-03-13 DIAGNOSIS — L723 Sebaceous cyst: Secondary | ICD-10-CM | POA: Diagnosis not present

## 2019-04-04 DIAGNOSIS — F419 Anxiety disorder, unspecified: Secondary | ICD-10-CM | POA: Diagnosis not present

## 2019-04-04 DIAGNOSIS — F410 Panic disorder [episodic paroxysmal anxiety] without agoraphobia: Secondary | ICD-10-CM | POA: Diagnosis not present

## 2019-04-04 DIAGNOSIS — F4323 Adjustment disorder with mixed anxiety and depressed mood: Secondary | ICD-10-CM | POA: Diagnosis not present

## 2019-04-04 DIAGNOSIS — G479 Sleep disorder, unspecified: Secondary | ICD-10-CM | POA: Diagnosis not present

## 2019-04-13 DIAGNOSIS — F4323 Adjustment disorder with mixed anxiety and depressed mood: Secondary | ICD-10-CM | POA: Diagnosis not present

## 2019-04-14 DIAGNOSIS — F419 Anxiety disorder, unspecified: Secondary | ICD-10-CM | POA: Diagnosis not present

## 2019-04-14 DIAGNOSIS — F410 Panic disorder [episodic paroxysmal anxiety] without agoraphobia: Secondary | ICD-10-CM | POA: Diagnosis not present

## 2019-04-14 DIAGNOSIS — J45909 Unspecified asthma, uncomplicated: Secondary | ICD-10-CM | POA: Diagnosis not present

## 2019-04-14 DIAGNOSIS — G479 Sleep disorder, unspecified: Secondary | ICD-10-CM | POA: Diagnosis not present

## 2019-06-05 DIAGNOSIS — F419 Anxiety disorder, unspecified: Secondary | ICD-10-CM | POA: Diagnosis not present

## 2019-06-05 DIAGNOSIS — G479 Sleep disorder, unspecified: Secondary | ICD-10-CM | POA: Diagnosis not present

## 2019-06-05 DIAGNOSIS — K5900 Constipation, unspecified: Secondary | ICD-10-CM | POA: Diagnosis not present

## 2019-06-05 DIAGNOSIS — F410 Panic disorder [episodic paroxysmal anxiety] without agoraphobia: Secondary | ICD-10-CM | POA: Diagnosis not present

## 2019-09-05 DIAGNOSIS — F419 Anxiety disorder, unspecified: Secondary | ICD-10-CM | POA: Diagnosis not present

## 2019-09-05 DIAGNOSIS — F410 Panic disorder [episodic paroxysmal anxiety] without agoraphobia: Secondary | ICD-10-CM | POA: Diagnosis not present

## 2019-09-05 DIAGNOSIS — G479 Sleep disorder, unspecified: Secondary | ICD-10-CM | POA: Diagnosis not present

## 2019-09-25 DIAGNOSIS — F410 Panic disorder [episodic paroxysmal anxiety] without agoraphobia: Secondary | ICD-10-CM | POA: Diagnosis not present

## 2019-09-25 DIAGNOSIS — Z6379 Other stressful life events affecting family and household: Secondary | ICD-10-CM | POA: Diagnosis not present

## 2019-09-25 DIAGNOSIS — F419 Anxiety disorder, unspecified: Secondary | ICD-10-CM | POA: Diagnosis not present

## 2019-09-25 DIAGNOSIS — G479 Sleep disorder, unspecified: Secondary | ICD-10-CM | POA: Diagnosis not present

## 2019-10-05 DIAGNOSIS — F419 Anxiety disorder, unspecified: Secondary | ICD-10-CM | POA: Diagnosis not present

## 2019-10-05 DIAGNOSIS — G479 Sleep disorder, unspecified: Secondary | ICD-10-CM | POA: Diagnosis not present

## 2019-10-05 DIAGNOSIS — F410 Panic disorder [episodic paroxysmal anxiety] without agoraphobia: Secondary | ICD-10-CM | POA: Diagnosis not present

## 2019-10-05 DIAGNOSIS — Z6379 Other stressful life events affecting family and household: Secondary | ICD-10-CM | POA: Diagnosis not present

## 2019-10-16 DIAGNOSIS — F419 Anxiety disorder, unspecified: Secondary | ICD-10-CM | POA: Diagnosis not present

## 2019-10-16 DIAGNOSIS — F410 Panic disorder [episodic paroxysmal anxiety] without agoraphobia: Secondary | ICD-10-CM | POA: Diagnosis not present

## 2019-10-16 DIAGNOSIS — Z6379 Other stressful life events affecting family and household: Secondary | ICD-10-CM | POA: Diagnosis not present

## 2019-10-16 DIAGNOSIS — G479 Sleep disorder, unspecified: Secondary | ICD-10-CM | POA: Diagnosis not present

## 2019-11-13 DIAGNOSIS — Z6379 Other stressful life events affecting family and household: Secondary | ICD-10-CM | POA: Diagnosis not present

## 2019-11-13 DIAGNOSIS — G479 Sleep disorder, unspecified: Secondary | ICD-10-CM | POA: Diagnosis not present

## 2019-11-13 DIAGNOSIS — F419 Anxiety disorder, unspecified: Secondary | ICD-10-CM | POA: Diagnosis not present

## 2020-06-11 ENCOUNTER — Other Ambulatory Visit: Payer: Self-pay | Admitting: Orthopedic Surgery

## 2020-06-11 DIAGNOSIS — M25561 Pain in right knee: Secondary | ICD-10-CM

## 2020-07-04 ENCOUNTER — Emergency Department (HOSPITAL_COMMUNITY)
Admission: EM | Admit: 2020-07-04 | Discharge: 2020-07-04 | Disposition: A | Discharge status: Home / Self Care | Payer: BC Managed Care – PPO | Attending: Emergency Medicine | Admitting: Emergency Medicine

## 2020-07-04 ENCOUNTER — Encounter (HOSPITAL_COMMUNITY): Payer: Self-pay

## 2020-07-04 ENCOUNTER — Other Ambulatory Visit: Payer: Self-pay

## 2020-07-04 ENCOUNTER — Emergency Department (HOSPITAL_COMMUNITY): Payer: BC Managed Care – PPO

## 2020-07-04 DIAGNOSIS — M79674 Pain in right toe(s): Secondary | ICD-10-CM

## 2020-07-04 DIAGNOSIS — M25461 Effusion, right knee: Secondary | ICD-10-CM | POA: Insufficient documentation

## 2020-07-04 DIAGNOSIS — J45909 Unspecified asthma, uncomplicated: Secondary | ICD-10-CM | POA: Diagnosis not present

## 2020-07-04 DIAGNOSIS — G8929 Other chronic pain: Secondary | ICD-10-CM | POA: Diagnosis not present

## 2020-07-04 DIAGNOSIS — M25561 Pain in right knee: Secondary | ICD-10-CM | POA: Insufficient documentation

## 2020-07-04 MED ORDER — HYDROCODONE-ACETAMINOPHEN 5-325 MG PO TABS: 1.0000 | ORAL_TABLET | Freq: Four times a day (QID) | ORAL | 0 refills | Status: AC | PRN
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
## 2020-07-04 NOTE — Discharge Instructions (Addendum)
I am prescribing you a narcotic medication called Vicodin.  Please do not mix this with alcohol or operate a motor vehicle after taking it.  Please be cautious if taking acetaminophen/Tylenol, as this is also present and Vicodin.  Do not exceed more than 4000 mg in 1 day.  Please apply ice to the knee and wear your knee brace as needed.  You can always return to the emergency department with new or worsening symptoms.  Please have the MRI obtained of your right knee tomorrow.  It was a pleasure to meet you.

## 2020-07-04 NOTE — Progress Notes (Signed)
Orthopedic Tech Progress Note Patient Details:  Stephanie Moore Sep 14, 1976 [id number]  Ortho Devices Type of Ortho Device: Knee splint, Crutches Ortho Device/Splint Location: Right Lower Extremity Ortho Device/Splint Interventions: Ordered, Application, Adjustment   Post Interventions Patient Tolerated: Well Instructions Provided: Adjustment of device, Care of device, Poper ambulation with device   Asaiah Hunnicutt P Harle Stanford 07/04/2020, 3:19 PM

## 2020-07-04 NOTE — ED Triage Notes (Signed)
Pt presents to ED from home with complaints of right knee pain and right great toe pain. Pt reports she has undergone several surgeries for torn meniscus on this knee and ortho has scheduled MRI for tomorrow. She states she is unable to wait until then. She reports she fell Sunday and injured right toe due to the knee pain. Increased swelling and warmth to right knee

## 2020-07-05 ENCOUNTER — Ambulatory Visit
Admission: RE | Admit: 2020-07-05 | Discharge: 2020-07-05 | Disposition: A | Discharge status: Home / Self Care | Payer: BLUE CROSS/BLUE SHIELD | Source: Ambulatory Visit | Attending: Orthopedic Surgery | Admitting: Orthopedic Surgery

## 2020-07-05 DIAGNOSIS — M25561 Pain in right knee: Secondary | ICD-10-CM

## 2021-11-03 ENCOUNTER — Other Ambulatory Visit: Payer: Self-pay | Admitting: Obstetrics and Gynecology

## 2021-11-03 DIAGNOSIS — R928 Other abnormal and inconclusive findings on diagnostic imaging of breast: Secondary | ICD-10-CM

## 2021-11-10 ENCOUNTER — Ambulatory Visit
Admission: RE | Admit: 2021-11-10 | Discharge: 2021-11-10 | Disposition: A | Discharge status: Home / Self Care | Payer: BC Managed Care – PPO | Source: Ambulatory Visit | Attending: Obstetrics and Gynecology | Admitting: Obstetrics and Gynecology

## 2021-11-10 DIAGNOSIS — R928 Other abnormal and inconclusive findings on diagnostic imaging of breast: Secondary | ICD-10-CM

## 2022-04-11 ENCOUNTER — Emergency Department (HOSPITAL_BASED_OUTPATIENT_CLINIC_OR_DEPARTMENT_OTHER)
Admission: EM | Admit: 2022-04-11 | Discharge: 2022-04-11 | Disposition: A | Discharge status: Home / Self Care | Payer: BC Managed Care – PPO | Attending: Emergency Medicine | Admitting: Emergency Medicine

## 2022-04-11 ENCOUNTER — Emergency Department (HOSPITAL_BASED_OUTPATIENT_CLINIC_OR_DEPARTMENT_OTHER): Payer: BC Managed Care – PPO

## 2022-04-11 ENCOUNTER — Encounter (HOSPITAL_BASED_OUTPATIENT_CLINIC_OR_DEPARTMENT_OTHER): Payer: Self-pay | Admitting: Emergency Medicine

## 2022-04-11 ENCOUNTER — Other Ambulatory Visit: Payer: Self-pay

## 2022-04-11 DIAGNOSIS — J45909 Unspecified asthma, uncomplicated: Secondary | ICD-10-CM | POA: Diagnosis not present

## 2022-04-11 DIAGNOSIS — M25561 Pain in right knee: Secondary | ICD-10-CM | POA: Diagnosis present

## 2022-04-11 DIAGNOSIS — Z96651 Presence of right artificial knee joint: Secondary | ICD-10-CM | POA: Diagnosis not present

## 2022-04-11 DIAGNOSIS — M25551 Pain in right hip: Secondary | ICD-10-CM | POA: Diagnosis not present

## 2022-04-11 DIAGNOSIS — M79604 Pain in right leg: Secondary | ICD-10-CM | POA: Diagnosis not present

## 2022-04-11 LAB — COMPREHENSIVE METABOLIC PANEL
ALT: 9 U/L (ref 0–44)
AST: 13 U/L — ABNORMAL LOW (ref 15–41)
Albumin: 4.8 g/dL (ref 3.5–5.0)
Alkaline Phosphatase: 63 U/L (ref 38–126)
Anion gap: 10 (ref 5–15)
BUN: 15 mg/dL (ref 6–20)
CO2: 25 mmol/L (ref 22–32)
Calcium: 9.8 mg/dL (ref 8.9–10.3)
Chloride: 103 mmol/L (ref 98–111)
Creatinine, Ser: 0.8 mg/dL (ref 0.44–1.00)
GFR, Estimated: >60 mL/min (ref >60)
Glucose, Bld: 88 mg/dL (ref 70–99)
Potassium: 3.7 mmol/L (ref 3.5–5.1)
Sodium: 138 mmol/L (ref 135–145)
Total Bilirubin: 0.3 mg/dL (ref 0.3–1.2)
Total Protein: 8.3 g/dL — ABNORMAL HIGH (ref 6.5–8.1)

## 2022-04-11 LAB — CBC
HCT: 35.3 % — ABNORMAL LOW (ref 36.0–46.0)
Hemoglobin: 12.3 g/dL (ref 12.0–15.0)
MCH: 29.5 pg (ref 26.0–34.0)
MCHC: 34.8 g/dL (ref 30.0–36.0)
MCV: 84.7 fL (ref 80.0–100.0)
Platelets: 247 10*3/uL (ref 150–400)
RBC: 4.17 MIL/uL (ref 3.87–5.11)
RDW: 13.3 % (ref 11.5–15.5)
WBC: 9.6 10*3/uL (ref 4.0–10.5)
nRBC: 0 % (ref 0.0–0.2)

## 2022-04-11 LAB — LIPASE, BLOOD: Lipase: 16 U/L (ref 11–51)

## 2022-04-11 MED ORDER — OXYCODONE-ACETAMINOPHEN 7.5-325 MG PO TABS: 1.0000 | ORAL_TABLET | ORAL | 0 refills | Status: AC | PRN

## 2022-04-11 MED ORDER — ONDANSETRON HCL 4 MG/2ML IJ SOLN
4.0000 mg | Freq: Once | INTRAMUSCULAR | Status: AC | PRN
  Administered 2022-04-11: 4 mg via INTRAVENOUS
  Filled 2022-04-11: qty 2

## 2022-04-11 MED ORDER — MORPHINE SULFATE (PF) 4 MG/ML IV SOLN
4.0000 mg | Freq: Once | INTRAVENOUS | Status: AC
  Administered 2022-04-11: 4 mg via INTRAVENOUS
  Filled 2022-04-11: qty 1

## 2022-04-11 NOTE — Discharge Instructions (Signed)
Your history, exam, work-up today did not show evidence of acute hardware abnormality, bony abnormality, fracture, or dislocation.  I do suspect that you have soft tissue with either ligamentous or tendinous injuries causing the symptoms as your pain was worsened when you try to move as compared to when we examine your knee passively.  The rest of your work-up was also reassuring.  Please rest and stay hydrated and use the pain medicine intermittently.  Please use the knee immobilizer and crutches to help minimize bending and weightbearing on the knee and follow-up with your orthopedics team.  If any symptoms change or worsen acutely, please return to the nearest Emergency Department. ?

## 2022-04-11 NOTE — ED Provider Notes (Signed)
?[facility] EMERGENCY DEPT ?Provider Note ? ? ?CSN: [id number] ?Arrival date & time: 04/11/22  1449 ? ?  ? ?History ? ?Chief Complaint  ?Patient presents with  ? Knee Pain  ? ? ?Stephanie Moore is a 46 y.o. female. ? ?The history is provided by the patient and medical records. No language interpreter was used.  ?Knee Pain ?Location:  Knee ?Time since incident:  1 week ?Injury: no   ?Knee location:  R knee ?Pain details:  ?  Quality:  Sharp and aching ?  Severity:  Severe ?  Onset quality:  Gradual ?  Timing:  Intermittent ?  Progression:  Waxing and waning ?Chronicity:  New ?Tetanus status:  Unknown ?Prior injury to area:  Yes ?Relieved by:  Nothing ?Worsened by:  Bearing weight, extension and flexion ?Ineffective treatments:  None tried ?Associated symptoms: swelling   ?Associated symptoms: no back pain, no decreased ROM, no fatigue, no fever, no itching, no muscle weakness, no neck pain, no numbness, no stiffness and no tingling   ? ?  ? ?Home Medications ?Prior to Admission medications   ?Medication Sig Start Date End Date Taking? Authorizing Provider  ?alprazolam (XANAX) 2 MG tablet Take 1 tablet (2 mg total) by mouth at bedtime as needed for sleep. 04/02/17   Morene Crocker, CNM  ?buPROPion (WELLBUTRIN SR) 150 MG 12 hr tablet TAKE 1 TABLET BY MOUTH TWICE A DAY 04/06/18   Morene Crocker, CNM  ?HYDROcodone-acetaminophen (NORCO/VICODIN) 5-325 MG tablet Take 1 tablet by mouth every 6 (six) hours as needed. 07/04/20   Rayna Sexton, PA-C  ?ibuprofen (ADVIL,MOTRIN) 800 MG tablet Take 1 tablet (800 mg total) by mouth every 8 (eight) hours as needed. 09/17/16   Morene Crocker, CNM  ?metroNIDAZOLE (FLAGYL) 500 MG tablet Take 1 tablet (500 mg total) by mouth 2 (two) times daily. 06/06/18   Morene Crocker, CNM  ?naproxen (NAPROSYN) 375 MG tablet Take 1 tablet twice daily as needed for joint pain. 12/05/18   Molpus, John, MD  ?phenazopyridine (PYRIDIUM) 200 MG tablet Take 1 tablet (200 mg total)  by mouth 3 (three) times daily as needed for pain. 06/06/18   Morene Crocker, CNM  ?sulfamethoxazole-trimethoprim (BACTRIM) 400-80 MG tablet Take 1 tablet by mouth 2 (two) times daily. 06/06/18   Morene Crocker, CNM  ?terconazole (TERAZOL 3) 0.8 % vaginal cream Place 1 applicator vaginally at bedtime. 10/17/18   Shelly Bombard, MD  ?   ? ?Allergies    ?Patient has no known allergies.   ? ?Review of Systems   ?Review of Systems  ?Constitutional:  Negative for chills, fatigue and fever.  ?HENT:  Negative for congestion.   ?Respiratory:  Negative for cough, chest tightness and shortness of breath.   ?Cardiovascular:  Negative for chest pain and palpitations.  ?Gastrointestinal:  Negative for abdominal pain, constipation, diarrhea, nausea and vomiting.  ?Genitourinary:  Negative for dysuria and flank pain.  ?Musculoskeletal:  Negative for back pain, neck pain, neck stiffness and stiffness.  ?Skin:  Negative for itching, rash and wound.  ?Neurological:  Negative for dizziness, weakness, light-headedness, numbness and headaches.  ?Psychiatric/Behavioral:  Negative for confusion.   ? ?Physical Exam ?Updated Vital Signs ?BP 120/88 (BP Location: Right Arm)   Pulse 95   Temp 98.5 ?F (36.9 ?C) (Oral)   Resp 16   Ht 5\' 6"  (1.676 m)   Wt 78 kg   SpO2 100%   BMI 27.76 kg/m?  ?Physical Exam ?Vitals and nursing  note reviewed.  ?Constitutional:   ?   General: She is not in acute distress. ?   Appearance: She is well-developed. She is not ill-appearing, toxic-appearing or diaphoretic.  ?HENT:  ?   Head: Normocephalic and atraumatic.  ?   Mouth/Throat:  ?   Mouth: Mucous membranes are moist.  ?Eyes:  ?   Conjunctiva/sclera: Conjunctivae normal.  ?Cardiovascular:  ?   Rate and Rhythm: Normal rate and regular rhythm.  ?   Heart sounds: No murmur heard. ?Pulmonary:  ?   Effort: Pulmonary effort is normal. No respiratory distress.  ?   Breath sounds: Normal breath sounds. No wheezing, rhonchi or rales.  ?Chest:  ?   Chest  wall: No tenderness.  ?Abdominal:  ?   General: Abdomen is flat.  ?   Palpations: Abdomen is soft.  ?   Tenderness: There is no abdominal tenderness. There is no right CVA tenderness, left CVA tenderness, guarding or rebound.  ?Musculoskeletal:     ?   General: Tenderness present.  ?   Cervical back: Neck supple. No tenderness.  ?   Right hip: Tenderness present.  ?   Right knee: No deformity, erythema, ecchymosis, lacerations or bony tenderness. Tenderness present.  ?   Right lower leg: No edema.  ?   Left lower leg: No edema.  ?     Legs: ? ?   Comments: Intact sensation, strength, pulses.  No pain with passive movement of the knee but tenderness to palpation and pain with patient moving the knee.  No crepitance, lacerations, or erythema.  No warmth.  Exam otherwise unremarkable.  ?Skin: ?   General: Skin is warm and dry.  ?   Capillary Refill: Capillary refill takes less than 2 seconds.  ?   Findings: No erythema, lesion or rash.  ?Neurological:  ?   General: No focal deficit present.  ?   Mental Status: She is alert.  ?   Sensory: No sensory deficit.  ?   Motor: No weakness.  ?Psychiatric:     ?   Mood and Affect: Mood normal.  ? ? ?ED Results / Procedures / Treatments   ?Labs ?(all labs ordered are listed, but only abnormal results are displayed) ?Labs Reviewed  ?COMPREHENSIVE METABOLIC PANEL - Abnormal; Notable for the following components:  ?    Result Value  ? Total Protein 8.3 (*)   ? AST 13 (*)   ? All other components within normal limits  ?CBC - Abnormal; Notable for the following components:  ? HCT 35.3 (*)   ? All other components within normal limits  ?LIPASE, BLOOD  ? ? ?EKG ?None ? ?Radiology ?US Venous Img Lower Unilateral Right ? ?Result Date: 04/11/2022 ?CLINICAL DATA:  Right knee pain for 1 week, initial encounter EXAM: RIGHT LOWER EXTREMITY VENOUS DOPPLER ULTRASOUND TECHNIQUE: Gray-scale sonography with graded compression, as well as color Doppler and duplex ultrasound were performed to evaluate  the lower extremity deep venous systems from the level of the common femoral vein and including the common femoral, femoral, profunda femoral, popliteal and calf veins including the posterior tibial, peroneal and gastrocnemius veins when visible. The superficial great saphenous vein was also interrogated. Spectral Doppler was utilized to evaluate flow at rest and with distal augmentation maneuvers in the common femoral, femoral and popliteal veins. COMPARISON:  None. FINDINGS: Contralateral Common Femoral Vein: Respiratory phasicity is normal and symmetric with the symptomatic side. No evidence of thrombus. Normal compressibility. Common Femoral Vein: No evidence of  thrombus. Normal compressibility, respiratory phasicity and response to augmentation. Saphenofemoral Junction: No evidence of thrombus. Normal compressibility and flow on color Doppler imaging. Profunda Femoral Vein: No evidence of thrombus. Normal compressibility and flow on color Doppler imaging. Femoral Vein: No evidence of thrombus. Normal compressibility, respiratory phasicity and response to augmentation. Popliteal Vein: No evidence of thrombus. Normal compressibility, respiratory phasicity and response to augmentation. Calf Veins: No evidence of thrombus. Normal compressibility and flow on color Doppler imaging. Superficial Great Saphenous Vein: No evidence of thrombus. Normal compressibility. Venous Reflux:  None. Other Findings:  None. IMPRESSION: No evidence of deep venous thrombosis. Electronically Signed   By: Inez Catalina M.D.   On: 04/11/2022 19:08  ? ?DG Knee Complete 4 Views Right ? ?Result Date: 04/11/2022 ?CLINICAL DATA:  Chronic right knee pain EXAM: RIGHT KNEE - COMPLETE 4 VIEW COMPARISON:  07/04/2020 FINDINGS: There is previous right knee arthroplasty. No recent fracture or dislocation is seen. In the AP view, there is small linear smooth marginated calcification medial to the proximal tibia, possibly residual from previous  injury/surgery or ligament calcification. There is soft tissue fullness in the suprapatellar bursa, possibly suggesting small effusion. Small smooth marginated calcifications are seen superior to the patella possibly r

## 2022-04-11 NOTE — ED Triage Notes (Signed)
Pt c/o right knee replacement a year ago in February. Pt reports feeling like rod poking under right knee. Pt is due for right hip replacement in about 30 days. ?

## 2024-05-27 LAB — COLOGUARD: COLOGUARD: NEGATIVE

## 2024-11-12 ENCOUNTER — Encounter (HOSPITAL_BASED_OUTPATIENT_CLINIC_OR_DEPARTMENT_OTHER): Payer: Self-pay | Admitting: Emergency Medicine

## 2024-11-12 ENCOUNTER — Telehealth: Payer: Self-pay

## 2024-11-12 ENCOUNTER — Emergency Department (HOSPITAL_BASED_OUTPATIENT_CLINIC_OR_DEPARTMENT_OTHER): Admitting: Radiology

## 2024-11-12 ENCOUNTER — Emergency Department (HOSPITAL_BASED_OUTPATIENT_CLINIC_OR_DEPARTMENT_OTHER)
Admission: EM | Admit: 2024-11-12 | Discharge: 2024-11-12 | Disposition: A | Discharge status: Home / Self Care | Attending: Emergency Medicine | Admitting: Emergency Medicine

## 2024-11-12 ENCOUNTER — Emergency Department (HOSPITAL_BASED_OUTPATIENT_CLINIC_OR_DEPARTMENT_OTHER)

## 2024-11-12 DIAGNOSIS — M546 Pain in thoracic spine: Secondary | ICD-10-CM | POA: Diagnosis not present

## 2024-11-12 DIAGNOSIS — W19XXXA Unspecified fall, initial encounter: Secondary | ICD-10-CM

## 2024-11-12 DIAGNOSIS — M545 Low back pain, unspecified: Secondary | ICD-10-CM | POA: Insufficient documentation

## 2024-11-12 DIAGNOSIS — M79632 Pain in left forearm: Secondary | ICD-10-CM | POA: Insufficient documentation

## 2024-11-12 DIAGNOSIS — Y92410 Unspecified street and highway as the place of occurrence of the external cause: Secondary | ICD-10-CM | POA: Insufficient documentation

## 2024-11-12 DIAGNOSIS — J45909 Unspecified asthma, uncomplicated: Secondary | ICD-10-CM | POA: Diagnosis not present

## 2024-11-12 DIAGNOSIS — M549 Dorsalgia, unspecified: Secondary | ICD-10-CM

## 2024-11-12 LAB — CBC WITH DIFFERENTIAL/PLATELET
Abs Immature Granulocytes: 0.02 K/uL (ref 0.00–0.07)
Basophils Absolute: 0.1 K/uL (ref 0.0–0.1)
Basophils Relative: 1 %
Eosinophils Absolute: 0.3 K/uL (ref 0.0–0.5)
Eosinophils Relative: 4 %
HCT: 39.2 % (ref 36.0–46.0)
Hemoglobin: 14.2 g/dL (ref 12.0–15.0)
Immature Granulocytes: 0 %
Lymphocytes Relative: 32 %
Lymphs Abs: 2.4 K/uL (ref 0.7–4.0)
MCH: 30.1 pg (ref 26.0–34.0)
MCHC: 36.2 g/dL — ABNORMAL HIGH (ref 30.0–36.0)
MCV: 83.1 fL (ref 80.0–100.0)
Monocytes Absolute: 0.7 K/uL (ref 0.1–1.0)
Monocytes Relative: 9 %
Neutro Abs: 4 K/uL (ref 1.7–7.7)
Neutrophils Relative %: 54 %
Platelets: 250 K/uL (ref 150–400)
RBC: 4.72 MIL/uL (ref 3.87–5.11)
RDW: 12.4 % (ref 11.5–15.5)
WBC: 7.5 K/uL (ref 4.0–10.5)
nRBC: 0 % (ref 0.0–0.2)

## 2024-11-12 LAB — BASIC METABOLIC PANEL WITH GFR
Anion gap: 11 (ref 5–15)
BUN: 10 mg/dL (ref 6–20)
CO2: 26 mmol/L (ref 22–32)
Calcium: 9.7 mg/dL (ref 8.9–10.3)
Chloride: 103 mmol/L (ref 98–111)
Creatinine, Ser: 0.67 mg/dL (ref 0.44–1.00)
GFR, Estimated: >60 mL/min (ref >60)
Glucose, Bld: 113 mg/dL — ABNORMAL HIGH (ref 70–99)
Potassium: 3.9 mmol/L (ref 3.5–5.1)
Sodium: 141 mmol/L (ref 135–145)

## 2024-11-12 LAB — PREGNANCY, URINE: Preg Test, Ur: NEGATIVE

## 2024-11-12 MED ORDER — METHOCARBAMOL 500 MG PO TABS: 500.0000 mg | ORAL_TABLET | Freq: Two times a day (BID) | ORAL | 0 refills | Status: AC

## 2024-11-12 MED ORDER — NAPROXEN 500 MG PO TABS: 500.0000 mg | ORAL_TABLET | Freq: Two times a day (BID) | ORAL | 0 refills | Status: AC

## 2024-11-12 MED ORDER — HYDROCODONE-ACETAMINOPHEN 5-325 MG PO TABS
1.0000 | ORAL_TABLET | Freq: Once | ORAL | Status: AC
  Administered 2024-11-12: 1 via ORAL
  Filled 2024-11-12: qty 1

## 2024-11-12 NOTE — Discharge Instructions (Addendum)
 It was a pleasure taking care of you today.  As discussed, all of your images were unremarkable for any broken bones or acute findings.  Did show some arthritis in your back.  I am sending you home with pain medication and a muscle relaxer.  Take as needed for pain.  Muscle relaxers can cause drowsiness so do not drive or operate machinery while on the medication.  Please follow-up with PCP if symptoms do not improve over the next few days.  Return to the ER for any worsening symptoms.
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
## 2024-11-12 NOTE — ED Notes (Signed)
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
